# Patient Record
Sex: Female | Born: 1935 | Race: Black or African American | Hispanic: No | Smoking: Former smoker
Health system: Southern US, Community
[De-identification: ages and names within clinical notes are randomized; demographics above are authoritative.]

## PROBLEM LIST (undated history)

## (undated) DIAGNOSIS — N390 Urinary tract infection, site not specified: Secondary | ICD-10-CM

## (undated) DIAGNOSIS — N200 Calculus of kidney: Secondary | ICD-10-CM

## (undated) DIAGNOSIS — G309 Alzheimer's disease, unspecified: Secondary | ICD-10-CM

## (undated) DIAGNOSIS — F0280 Dementia in other diseases classified elsewhere without behavioral disturbance: Secondary | ICD-10-CM

## (undated) DIAGNOSIS — Z9289 Personal history of other medical treatment: Secondary | ICD-10-CM

## (undated) DIAGNOSIS — H409 Unspecified glaucoma: Secondary | ICD-10-CM

## (undated) DIAGNOSIS — H9193 Unspecified hearing loss, bilateral: Secondary | ICD-10-CM

## (undated) DIAGNOSIS — K5900 Constipation, unspecified: Secondary | ICD-10-CM

## (undated) DIAGNOSIS — M1990 Unspecified osteoarthritis, unspecified site: Secondary | ICD-10-CM

## (undated) DIAGNOSIS — H548 Legal blindness, as defined in USA: Secondary | ICD-10-CM

## (undated) DIAGNOSIS — I10 Essential (primary) hypertension: Secondary | ICD-10-CM

## (undated) HISTORY — PX: CATARACT EXTRACTION, BILATERAL: SHX1313

## (undated) HISTORY — PX: ABDOMINAL HYSTERECTOMY: SHX81

---

## 2000-09-04 ENCOUNTER — Ambulatory Visit (HOSPITAL_COMMUNITY): Admission: RE | Admit: 2000-09-04 | Discharge: 2000-09-05 | Payer: Self-pay | Admitting: Ophthalmology

## 2000-09-04 ENCOUNTER — Encounter (INDEPENDENT_AMBULATORY_CARE_PROVIDER_SITE_OTHER): Payer: Self-pay | Admitting: Specialist

## 2001-01-18 ENCOUNTER — Emergency Department (HOSPITAL_COMMUNITY): Admission: EM | Admit: 2001-01-18 | Discharge: 2001-01-19 | Payer: Self-pay | Admitting: *Deleted

## 2001-01-19 ENCOUNTER — Encounter: Payer: Self-pay | Admitting: *Deleted

## 2001-12-04 ENCOUNTER — Encounter: Payer: Self-pay | Admitting: Family Medicine

## 2001-12-04 ENCOUNTER — Ambulatory Visit (HOSPITAL_COMMUNITY): Admission: RE | Admit: 2001-12-04 | Discharge: 2001-12-04 | Payer: Self-pay | Admitting: Family Medicine

## 2001-12-11 ENCOUNTER — Ambulatory Visit (HOSPITAL_COMMUNITY): Admission: RE | Admit: 2001-12-11 | Discharge: 2001-12-11 | Payer: Self-pay | Admitting: Family Medicine

## 2001-12-11 ENCOUNTER — Encounter: Payer: Self-pay | Admitting: Family Medicine

## 2002-02-16 ENCOUNTER — Encounter: Payer: Self-pay | Admitting: Emergency Medicine

## 2002-02-16 ENCOUNTER — Inpatient Hospital Stay (HOSPITAL_COMMUNITY): Admission: EM | Admit: 2002-02-16 | Discharge: 2002-02-21 | Payer: Self-pay | Admitting: Emergency Medicine

## 2002-02-16 ENCOUNTER — Encounter: Payer: Self-pay | Admitting: Internal Medicine

## 2002-02-18 ENCOUNTER — Encounter: Payer: Self-pay | Admitting: Internal Medicine

## 2002-02-19 ENCOUNTER — Encounter: Payer: Self-pay | Admitting: Internal Medicine

## 2002-12-13 ENCOUNTER — Encounter: Payer: Self-pay | Admitting: Family Medicine

## 2002-12-13 ENCOUNTER — Ambulatory Visit (HOSPITAL_COMMUNITY): Admission: RE | Admit: 2002-12-13 | Discharge: 2002-12-13 | Payer: Self-pay | Admitting: Family Medicine

## 2003-02-23 ENCOUNTER — Encounter: Payer: Self-pay | Admitting: *Deleted

## 2003-02-23 ENCOUNTER — Emergency Department (HOSPITAL_COMMUNITY): Admission: EM | Admit: 2003-02-23 | Discharge: 2003-02-23 | Payer: Self-pay | Admitting: Emergency Medicine

## 2004-04-19 ENCOUNTER — Ambulatory Visit (HOSPITAL_COMMUNITY): Admission: RE | Admit: 2004-04-19 | Discharge: 2004-04-19 | Payer: Self-pay | Admitting: Family Medicine

## 2005-05-06 ENCOUNTER — Ambulatory Visit (HOSPITAL_COMMUNITY): Admission: RE | Admit: 2005-05-06 | Discharge: 2005-05-06 | Payer: Self-pay | Admitting: Family Medicine

## 2005-08-28 ENCOUNTER — Ambulatory Visit (HOSPITAL_COMMUNITY): Admission: RE | Admit: 2005-08-28 | Discharge: 2005-08-28 | Payer: Self-pay | Admitting: Family Medicine

## 2006-04-17 ENCOUNTER — Emergency Department (HOSPITAL_COMMUNITY): Admission: EM | Admit: 2006-04-17 | Discharge: 2006-04-17 | Payer: Self-pay | Admitting: Emergency Medicine

## 2006-08-17 ENCOUNTER — Emergency Department (HOSPITAL_COMMUNITY): Admission: EM | Admit: 2006-08-17 | Discharge: 2006-08-17 | Payer: Self-pay | Admitting: Dermatology

## 2006-12-08 ENCOUNTER — Inpatient Hospital Stay (HOSPITAL_COMMUNITY): Admission: EM | Admit: 2006-12-08 | Discharge: 2006-12-10 | Payer: Self-pay | Admitting: Emergency Medicine

## 2006-12-09 ENCOUNTER — Ambulatory Visit: Payer: Self-pay | Admitting: Vascular Surgery

## 2006-12-09 ENCOUNTER — Encounter (INDEPENDENT_AMBULATORY_CARE_PROVIDER_SITE_OTHER): Payer: Self-pay | Admitting: Cardiology

## 2006-12-09 ENCOUNTER — Encounter: Payer: Self-pay | Admitting: Vascular Surgery

## 2007-01-27 ENCOUNTER — Ambulatory Visit (HOSPITAL_COMMUNITY): Admission: RE | Admit: 2007-01-27 | Discharge: 2007-01-28 | Payer: Self-pay | Admitting: Ophthalmology

## 2007-02-21 ENCOUNTER — Inpatient Hospital Stay (HOSPITAL_COMMUNITY): Admission: EM | Admit: 2007-02-21 | Discharge: 2007-02-26 | Payer: Self-pay | Admitting: Emergency Medicine

## 2007-05-04 ENCOUNTER — Inpatient Hospital Stay (HOSPITAL_COMMUNITY): Admission: EM | Admit: 2007-05-04 | Discharge: 2007-05-06 | Payer: Self-pay | Admitting: *Deleted

## 2009-02-09 ENCOUNTER — Emergency Department (HOSPITAL_COMMUNITY): Admission: EM | Admit: 2009-02-09 | Discharge: 2009-02-09 | Payer: Self-pay | Admitting: Emergency Medicine

## 2010-08-16 ENCOUNTER — Inpatient Hospital Stay (HOSPITAL_COMMUNITY): Admission: EM | Admit: 2010-08-16 | Discharge: 2010-04-03 | Payer: Self-pay | Admitting: Emergency Medicine

## 2010-09-30 ENCOUNTER — Encounter: Payer: Self-pay | Admitting: Family Medicine

## 2010-10-18 ENCOUNTER — Emergency Department (HOSPITAL_COMMUNITY)
Admission: EM | Admit: 2010-10-18 | Discharge: 2010-10-18 | Disposition: A | Discharge status: Home / Self Care | Payer: Medicare Other | Attending: Emergency Medicine | Admitting: Emergency Medicine

## 2010-10-18 ENCOUNTER — Emergency Department (HOSPITAL_COMMUNITY): Payer: Medicare Other

## 2010-10-18 DIAGNOSIS — I10 Essential (primary) hypertension: Secondary | ICD-10-CM | POA: Insufficient documentation

## 2010-10-18 DIAGNOSIS — F0280 Dementia in other diseases classified elsewhere without behavioral disturbance: Secondary | ICD-10-CM | POA: Insufficient documentation

## 2010-10-18 DIAGNOSIS — N390 Urinary tract infection, site not specified: Secondary | ICD-10-CM | POA: Insufficient documentation

## 2010-10-18 DIAGNOSIS — G309 Alzheimer's disease, unspecified: Secondary | ICD-10-CM | POA: Insufficient documentation

## 2010-10-18 DIAGNOSIS — Z87442 Personal history of urinary calculi: Secondary | ICD-10-CM | POA: Insufficient documentation

## 2010-10-18 DIAGNOSIS — Z66 Do not resuscitate: Secondary | ICD-10-CM | POA: Insufficient documentation

## 2010-10-18 DIAGNOSIS — R319 Hematuria, unspecified: Secondary | ICD-10-CM | POA: Insufficient documentation

## 2010-10-18 LAB — HEPATIC FUNCTION PANEL
Albumin: 2.9 g/dL — ABNORMAL LOW (ref 3.5–5.2)
Alkaline Phosphatase: 65 U/L (ref 39–117)
Indirect Bilirubin: 0.6 mg/dL (ref 0.3–0.9)
Total Bilirubin: 0.9 mg/dL (ref 0.3–1.2)

## 2010-10-18 LAB — URINALYSIS, ROUTINE W REFLEX MICROSCOPIC
Ketones, ur: 15 mg/dL — AB
Protein, ur: 100 mg/dL — AB
Urine Glucose, Fasting: NEGATIVE mg/dL
Urobilinogen, UA: 0.2 mg/dL (ref 0.0–1.0)

## 2010-10-18 LAB — DIFFERENTIAL
Basophils Relative: 0 % (ref 0–1)
Eosinophils Absolute: 0.1 10*3/uL (ref 0.0–0.7)
Eosinophils Relative: 2 % (ref 0–5)
Lymphs Abs: 1.7 10*3/uL (ref 0.7–4.0)
Monocytes Relative: 22 % — ABNORMAL HIGH (ref 3–12)
Neutrophils Relative %: 35 % — ABNORMAL LOW (ref 43–77)

## 2010-10-18 LAB — BASIC METABOLIC PANEL
BUN: 11 mg/dL (ref 6–23)
Calcium: 9.4 mg/dL (ref 8.4–10.5)
Chloride: 109 mEq/L (ref 96–112)
Creatinine, Ser: 0.84 mg/dL (ref 0.4–1.2)
GFR calc Af Amer: >60 mL/min (ref >60)
GFR calc non Af Amer: >60 mL/min (ref >60)

## 2010-10-18 LAB — CBC
MCH: 29.2 pg (ref 26.0–34.0)
MCHC: 33.4 g/dL (ref 30.0–36.0)
MCV: 87.3 fL (ref 78.0–100.0)
Platelets: 134 10*3/uL — ABNORMAL LOW (ref 150–400)
RDW: 14.1 % (ref 11.5–15.5)

## 2010-10-19 LAB — URINE CULTURE: Culture: NO GROWTH

## 2010-11-24 LAB — CBC
HCT: 31.5 % — ABNORMAL LOW (ref 36.0–46.0)
HCT: 33 % — ABNORMAL LOW (ref 36.0–46.0)
HCT: 36.1 % (ref 36.0–46.0)
Hemoglobin: 12.2 g/dL (ref 12.0–15.0)
Hemoglobin: 12.4 g/dL (ref 12.0–15.0)
MCH: 30.3 pg (ref 26.0–34.0)
MCH: 30.5 pg (ref 26.0–34.0)
MCH: 30.6 pg (ref 26.0–34.0)
MCH: 30.7 pg (ref 26.0–34.0)
MCH: 30.7 pg (ref 26.0–34.0)
MCH: 30.9 pg (ref 26.0–34.0)
MCHC: 33.4 g/dL (ref 30.0–36.0)
MCHC: 34.5 g/dL (ref 30.0–36.0)
MCV: 88.6 fL (ref 78.0–100.0)
MCV: 89.6 fL (ref 78.0–100.0)
MCV: 90.2 fL (ref 78.0–100.0)
MCV: 90.4 fL (ref 78.0–100.0)
MCV: 90.4 fL (ref 78.0–100.0)
MCV: 90.9 fL (ref 78.0–100.0)
MCV: 91.3 fL (ref 78.0–100.0)
MCV: 91.6 fL (ref 78.0–100.0)
Platelets: 143 10*3/uL — ABNORMAL LOW (ref 150–400)
Platelets: 153 10*3/uL (ref 150–400)
Platelets: 155 10*3/uL (ref 150–400)
Platelets: 155 10*3/uL (ref 150–400)
Platelets: 163 10*3/uL (ref 150–400)
Platelets: 165 10*3/uL (ref 150–400)
Platelets: 172 10*3/uL (ref 150–400)
Platelets: 173 10*3/uL (ref 150–400)
RBC: 3.99 MIL/uL (ref 3.87–5.11)
RBC: 4.03 MIL/uL (ref 3.87–5.11)
RBC: 4.11 MIL/uL (ref 3.87–5.11)
RDW: 11.9 % (ref 11.5–15.5)
RDW: 12.2 % (ref 11.5–15.5)
RDW: 12.2 % (ref 11.5–15.5)
RDW: 12.2 % (ref 11.5–15.5)
RDW: 12.3 % (ref 11.5–15.5)
RDW: 12.5 % (ref 11.5–15.5)
RDW: 12.5 % (ref 11.5–15.5)
WBC: 4.9 10*3/uL (ref 4.0–10.5)
WBC: 4.9 10*3/uL (ref 4.0–10.5)
WBC: 5.2 10*3/uL (ref 4.0–10.5)
WBC: 5.7 10*3/uL (ref 4.0–10.5)
WBC: 6 10*3/uL (ref 4.0–10.5)

## 2010-11-24 LAB — DIFFERENTIAL
Basophils Absolute: 0 10*3/uL (ref 0.0–0.1)
Basophils Absolute: 0 10*3/uL (ref 0.0–0.1)
Basophils Absolute: 0.1 10*3/uL (ref 0.0–0.1)
Basophils Relative: 1 % (ref 0–1)
Basophils Relative: 1 % (ref 0–1)
Basophils Relative: 1 % (ref 0–1)
Eosinophils Absolute: 0.2 10*3/uL (ref 0.0–0.7)
Eosinophils Absolute: 0.2 10*3/uL (ref 0.0–0.7)
Eosinophils Absolute: 0.2 10*3/uL (ref 0.0–0.7)
Eosinophils Absolute: 0.2 10*3/uL (ref 0.0–0.7)
Eosinophils Relative: 3 % (ref 0–5)
Eosinophils Relative: 4 % (ref 0–5)
Eosinophils Relative: 4 % (ref 0–5)
Eosinophils Relative: 5 % (ref 0–5)
Lymphocytes Relative: 28 % (ref 12–46)
Lymphocytes Relative: 30 % (ref 12–46)
Lymphs Abs: 1.8 10*3/uL (ref 0.7–4.0)
Lymphs Abs: 2 10*3/uL (ref 0.7–4.0)
Monocytes Absolute: 0.8 10*3/uL (ref 0.1–1.0)
Monocytes Relative: 15 % — ABNORMAL HIGH (ref 3–12)
Neutro Abs: 3.2 10*3/uL (ref 1.7–7.7)
Neutrophils Relative %: 45 % (ref 43–77)
Neutrophils Relative %: 46 % (ref 43–77)
Neutrophils Relative %: 47 % (ref 43–77)
Neutrophils Relative %: 55 % (ref 43–77)

## 2010-11-24 LAB — COMPREHENSIVE METABOLIC PANEL
AST: 28 U/L (ref 0–37)
CO2: 26 mEq/L (ref 19–32)
Calcium: 9.6 mg/dL (ref 8.4–10.5)
Creatinine, Ser: 0.63 mg/dL (ref 0.4–1.2)
GFR calc Af Amer: >60 mL/min (ref >60)
GFR calc non Af Amer: >60 mL/min (ref >60)
Glucose, Bld: 93 mg/dL (ref 70–99)
Sodium: 138 mEq/L (ref 135–145)
Total Protein: 6.6 g/dL (ref 6.0–8.3)

## 2010-11-24 LAB — URINALYSIS, ROUTINE W REFLEX MICROSCOPIC
Ketones, ur: 40 mg/dL — AB
Nitrite: POSITIVE — AB
Protein, ur: >300 mg/dL — AB
Urobilinogen, UA: 1 mg/dL (ref 0.0–1.0)

## 2010-11-24 LAB — BASIC METABOLIC PANEL
BUN: 2 mg/dL — ABNORMAL LOW (ref 6–23)
BUN: 3 mg/dL — ABNORMAL LOW (ref 6–23)
BUN: 3 mg/dL — ABNORMAL LOW (ref 6–23)
BUN: 3 mg/dL — ABNORMAL LOW (ref 6–23)
CO2: 20 mEq/L (ref 19–32)
CO2: 21 mEq/L (ref 19–32)
CO2: 22 mEq/L (ref 19–32)
Chloride: 109 mEq/L (ref 96–112)
Chloride: 109 mEq/L (ref 96–112)
Chloride: 111 mEq/L (ref 96–112)
Creatinine, Ser: 0.46 mg/dL (ref 0.4–1.2)
Creatinine, Ser: 0.49 mg/dL (ref 0.4–1.2)
Creatinine, Ser: 0.61 mg/dL (ref 0.4–1.2)
Creatinine, Ser: 0.63 mg/dL (ref 0.4–1.2)
GFR calc Af Amer: >60 mL/min (ref >60)
GFR calc non Af Amer: >60 mL/min (ref >60)
GFR calc non Af Amer: >60 mL/min (ref >60)
Glucose, Bld: 85 mg/dL (ref 70–99)
Potassium: 3.1 mEq/L — ABNORMAL LOW (ref 3.5–5.1)

## 2010-11-24 LAB — APTT: aPTT: 22 seconds — ABNORMAL LOW (ref 24–37)

## 2010-11-24 LAB — HEMOGLOBIN AND HEMATOCRIT, BLOOD
HCT: 29.7 % — ABNORMAL LOW (ref 36.0–46.0)
HCT: 30.9 % — ABNORMAL LOW (ref 36.0–46.0)
Hemoglobin: 10.5 g/dL — ABNORMAL LOW (ref 12.0–15.0)
Hemoglobin: 10.5 g/dL — ABNORMAL LOW (ref 12.0–15.0)

## 2010-11-24 LAB — URINE CULTURE
Colony Count: NO GROWTH
Culture: NO GROWTH
Special Requests: NEGATIVE

## 2010-11-24 LAB — URINE MICROSCOPIC-ADD ON

## 2010-11-24 LAB — PROTIME-INR
INR: 0.97 (ref 0.00–1.49)
Prothrombin Time: 12.8 seconds (ref 11.6–15.2)

## 2010-11-24 LAB — MAGNESIUM: Magnesium: 2.1 mg/dL (ref 1.5–2.5)

## 2010-12-17 LAB — CBC
HCT: 39.7 % (ref 36.0–46.0)
Hemoglobin: 12.9 g/dL (ref 12.0–15.0)
RBC: 4.25 MIL/uL (ref 3.87–5.11)
RDW: 13.3 % (ref 11.5–15.5)

## 2010-12-17 LAB — URINALYSIS, ROUTINE W REFLEX MICROSCOPIC
Bilirubin Urine: NEGATIVE
Nitrite: NEGATIVE
Protein, ur: 30 mg/dL — AB
Specific Gravity, Urine: 1.014 (ref 1.005–1.030)
Urobilinogen, UA: 0.2 mg/dL (ref 0.0–1.0)

## 2010-12-17 LAB — POCT I-STAT, CHEM 8
BUN: 12 mg/dL (ref 6–23)
Calcium, Ion: 1.3 mmol/L (ref 1.12–1.32)
Chloride: 108 mEq/L (ref 96–112)
Creatinine, Ser: 0.9 mg/dL (ref 0.4–1.2)
Glucose, Bld: 88 mg/dL (ref 70–99)
TCO2: 22 mmol/L (ref 0–100)

## 2010-12-17 LAB — DIFFERENTIAL
Basophils Absolute: 0.1 10*3/uL (ref 0.0–0.1)
Eosinophils Relative: 2 % (ref 0–5)
Lymphocytes Relative: 27 % (ref 12–46)
Lymphs Abs: 1.8 10*3/uL (ref 0.7–4.0)
Monocytes Absolute: 0.9 10*3/uL (ref 0.1–1.0)
Monocytes Relative: 14 % — ABNORMAL HIGH (ref 3–12)

## 2010-12-17 LAB — URINE MICROSCOPIC-ADD ON

## 2011-01-22 NOTE — Discharge Summary (Signed)
NAME:  Jean Shaw, Jean Shaw          ACCOUNT NO.:  [id number]   MEDICAL RECORD NO.:  [id number]          PATIENT TYPE:  INP   LOCATION:  6705                         FACILITY:  [facility]   PHYSICIAN:  Fleet Contras, M.D.    DATE OF BIRTH:  [birth year]   DATE OF ADMISSION:  12/08/2006  DATE OF DISCHARGE:  12/10/2006                               DISCHARGE SUMMARY   HISTORY OF PRESENTING ILLNESS:  [name] is a 77 year old African-  American lady with a past medical history significant for hypertension,  Alzheimer's dementia, hearing impairment, and glaucoma.  She was brought  into the emergency room by her daughter with complaints of near syncope,  but she did not fall.  Relation by the EMS at the scene, she was found  to have a low blood pressure, but on arrival at the emergency room her  blood pressure was normal.  She had had no fevers or chills.  She had  apparently had a flu-like syndrome.  Been having frequent urination, but  no vomiting or diarrhea.  In the emergency room, her blood pressure was  160/78, heart rate of 87, respiratory rate of 18, temperature 97.5, O2  sats on room air was 98 to 100%.  She was well-hydrated.  She was  afebrile.  She was not icteric.  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  Her chest was clear to auscultation.  Abdomen was benign.   LABORATORY DATA:  Shows trace leukocyte esterase with 3 to 6 WBCs per  high-power field urinalysis.  Chest x-ray shows cardiomegaly with no  acute infiltrates.  A CT scan of her head was negative.  Sodium was 144,  potassium 3.5, chloride 113, bicarbonate 17, BUN 14, glucose 89.  Hemoglobin was 13.3, hematocrit 39.  CT scan of the chest was negative  for pulmonary embolism.   She was admitted to the hospital for close monitoring and treatment of  urinary tract infection.   HOSPITAL COURSE:  On admission, the patient was started on IV fluids and  IV Cipro.  Her blood pressure medication were resumed the  following  morning.  Potassium was repleted orally.  TSH was checked and was within  normal limits.  Carotid Doppler study was performed.  This showed no  carotid artery stenosis.  A 2D echocardiogram was normal for LV ejection  fraction.  On December 10, 2006, her vital signs were stable.  She was  started on a full diet and had no further symptoms.  She was therefore  considered stable for discharge home.   DISCHARGE DIAGNOSES:  1. Near syncope.  Resolved.  2. Urinary tract infection.  3. Hypertension.  4. Alzheimer's dementia.   DISCHARGE MEDICATIONS:  1. Aspirin 81 mg daily.  2. Aricept 10 mg daily.  3. Namenda 5 mg b.i.d.  4. Avapro 150 three times daily.  5. Cipro 250 mg b.i.d. for five more days.   FOLLOWUP:  She is to follow up with me in the office in 2 weeks.   CONDITION ON DISCHARGE:  Condition on discharge is stable.   DISPOSITION:  Home.  Fleet Contras, M.D.  Electronically Signed     EA/MEDQ  D:  02/17/2007  T:  02/18/2007  Job:  [id number]

## 2011-01-25 NOTE — Discharge Summary (Signed)
NAME:  Jean Shaw, KULIK          ACCOUNT NO.:  [id number]   MEDICAL RECORD NO.:  [id number]          PATIENT TYPE:  INP   LOCATION:  3701                         FACILITY:  [facility]   PHYSICIAN:  Fleet Contras, M.D.    DATE OF BIRTH:  07/18/1929   DATE OF ADMISSION:  02/21/2007  DATE OF DISCHARGE:  02/26/2007                               DISCHARGE SUMMARY   HISTORY OF PRESENT ILLNESS:  Ms. Rochefort is a 77 year old African-  American lady with a past medical history significant for hypertension,  Alzheimer's dementia, deafness, blindness.  She was admitted through the  emergency room at [facility] with an episode of a near syncope  at home, she did not pass out, and denied having any chest pain or  shortness of breath.  She had no falls, no fevers or chills.  By the  time she was picked up by the EMS, she was noted to be hypotensive,  bradycardiac with a heart rate in the 40s.  On arrival at the emergency  room, her blood pressure and her heart rates were normal.  On admission,  laboratory data shows a low blood sugar of 67.  The daughter reported  that she had not been eating or drinking much recently.   PHYSICAL EXAMINATION:  GENERAL/VITALS:  On admission, she was slightly  agitated.  Initial blood pressure was 147/63, heart rate was 62,  respiratory rate of 20, temperature 97.4, her O2 sats on room air was  100%.  She was not pale, she was afebrile, she was not icteric.  Her  lips cyanotic although well hydrated.  CHEST:  Clear to auscultation.  ABDOMEN:  Benign.  PSYCH/NEURO:  She was alert, responsiveness, no focal deficits.   LABORATORY DATA:  Negative urinalysis.  Chest x-ray showed mild  cardiomegaly with bibasilar atelectasis.  Sodium was 141, potassium 4.4,  chloride 114, bicarbonate was 18, BUN 14, creatinine 0.8, glucose of 67.  EKG showed normal sinus rhythm.   ADMISSION DIAGNOSES:  1. Near syncope.  2. Bradycardia.  3. Hypoglycemia.   HOSPITAL COURSE:   On admission, she was placed in a telemetry bed.  She  was started on gentle IV hydration.  DVT and GI prophylaxis.  Tele  monitoring was monitored.  Serial CKs and troponin were done to rule out  acute coronary syndrome, and this was all negative.  A 2-D  echocardiogram and carotid Doppler were also performed.  A cardiology  consult was requested to evaluate for possible sick sinus syndrome.  Due  to poor oral intake, she was started on Megace.  CBGs were checked 8,  h.s., a.c.  For the past 24 hours, this has been stable.  Patient was  seen by Dr. Nanetta Batty, cardiologist, who recommended outpatient  event monitor for possible cause of presyncope.  Morning cortisol level  was 1.1, TSH was 5.26.  Endocrinology consult was requested as the  patient was a friend of Dr. Margaretmary Bayley who thought that cortisol  suppression was partly due to eye drops containing Decadron Pred Forte,  TobraDex which may have caused renal insufficiency.  He recommended a  tapering  dose of prednisone for a couple of weeks.  A D-dimer was  performed and this was slightly elevated at 1.1.  A CT scan of the chest  was, therefore, performed to rule out pulmonary embolism, and this was  negative.  She had a recent carotid Doppler and 2-D echocardiogram done  and these were negative, and these were repeated.  On February 26, 2007, she  was feeling much better, eating more, and was eager to go home.  Blood  pressure was elevated at 160/75, and she was, therefore, resumed on her  Avapro 60 mg daily as well as prior to admission medications, including  Aricept and Namenda.  Vital signs were stable, and she was felt stable  for discharge.   DISCHARGE MEDICATIONS:  1. Avapro 150 mg daily.  2. Namenda 5 mg b.i.d.  3. Aricept 10 mg q.h.s.  4. Diamox 1200 mg b.i.d.  5. Alphagan eye drops 1 drop to each eye b.i.d.  6. Aspirin 325 mg daily.  7. Prednisone 5 mg b.i.d. for 7 days then 1 mg daily.   She is to follow up with  me in the office in about 2 weeks.  This plan  of care was discussed with her and her daughter.  Their questions were  answered.      Fleet Contras, M.D.  Electronically Signed     EA/MEDQ  D:  03/30/2007  T:  03/31/2007  Job:  [id number]

## 2011-01-25 NOTE — Op Note (Signed)
NAME:  Jean Shaw, Jean Shaw          ACCOUNT NO.:  [id number]   MEDICAL RECORD NO.:  [id number]          PATIENT TYPE:  AMB   LOCATION:  SDS                          FACILITY:  [facility]   PHYSICIAN:  John D. Ashley Royalty, M.D. DATE OF BIRTH:  08/26/1929   DATE OF PROCEDURE:  01/27/2007  DATE OF DISCHARGE:                               OPERATIVE REPORT   ADMISSION DIAGNOSIS:  Malignant glaucoma left eye.   PROCEDURES:  Pars plana vitrectomy, retinal photocoagulation,  capsulectomy, peripheral iridectomy all in the left eye.   SURGEON:  Alan Mulder, M.D.   ASSISTANT:  Rosalie Doctor, MA   ANESTHESIA:  General.   DETAILS:  Usual prep and drape, trocars at 10, 2 and 4 o'clock.  Infusion port at 4 o'clock.  Provisc placed on the corneal surface.  The  pars plana vitrectomy was begun just behind the pseudophakos.  The  posterior capsule was opacified and removed with the vitreous cutter.  The vitrectomy was carried up to the edge of the iris from behind so  that all vitreous was removed from the backside of the iris.  A large  peripheral iridectomy was created at 5 o'clock.  The vitrectomy was  carried posteriorly where vitreous strands were encountered.  Provisc  was placed on the corneal surface and the biome viewing system was moved  into place.  The vitrectomy was carried down to the macular surface in a  core fashion.  It was carried out into the far periphery and the small  pupil limited the view.  The vitrectomy was carried down to the vitreous  base for 360 degrees.  At this point the endolaser was positioned in the  eye and 588 burns were placed around the retinal periphery in two rows  with a power of 1200 milliwatts, 1000 microns each and 0.1 seconds each.  The instruments were removed from the eye and the trocars were removed  from the eye.  The conjunctiva was allowed to slide over the scleral  wounds and the wounds were held until tight.  Polymyxin and gentamicin  were irrigated  to tenon's space, Decadron 10 mg was injected into the  lower subconjunctival space.  Atropine solution was applied.  Marcaine  was injected around the globe for postop pain.  TobraDex ophthalmic  ointment was placed as well as a patch and shield.  The closing pressure  was 15 with a Baer keratometer.  Complications none.  Duration 1 hour.  The patient is awakened and taken to recovery in satisfactory condition.      [name], M.D.  Electronically Signed     JDM/MEDQ  D:  01/27/2007  T:  01/27/2007  Job:  [id number]

## 2011-01-25 NOTE — Discharge Summary (Signed)
[facility]  Patient:    Jean Shaw, Jean Shaw Visit Number: [id number] MRN: [id number]          Service Type: MED Location: 3A Y782 [id number] Attending Physician:  Elliot Cousin Dictated by:   Elliot Cousin, M.D. Admit Date:  02/16/2002 Discharge Date: 02/21/2002                             Discharge Summary  DISCHARGE DIAGNOSES: 1. Dyspnea on exertion. 2. Abdominal fullness/epigastric pain. 3. Moderate acute gastroduodenitis per esophagogastroduodenoscopy,    February 19, 2002. 4. Bradycardia. 5. Hypertension. 6. Mildly elevated pulmonary pressures and mild tricuspid regurgitation with    ejection fraction 55-60% per 2-D echocardiogram, February 17, 2002.  SECONDARY DISCHARGE DIAGNOSES: 1. Chronic dizziness thought to be secondary to vertigo. 2. Bilateral glaucoma, status post left eye surgery secondary to glaucoma. 3. History of total abdominal hysterectomy. 4. Hospitalization, July 2001, secondary to bradycardia and near-syncope.    a. A CT scan of the head revealed no acute abnormalities.    b. Ultrasound and carotid Dopplers bilaterally revealed no significant       abnormality.    c. Two-dimensional echocardiogram in July 2001 revealed moderate left       ventricular hypertrophy, ejection fraction 65-70%, mild trivial mitral       regurgitation and mild elevation in right-sided pressures with normal       right ventricular function. 5. History of abdominal pain in July of 2001. 6. Poor hearing acuity. 7. 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  DISCHARGE MEDICATIONS: 1. Avapro 150 mg q.d. 2. Plendil 5 mg q.d. 3. Reglan 5 mg q.a.c. and q.h.s. 4. Enteric-coated aspirin 81 mg one q.d. 5. Lumigan 0.03% eye drops -- one drop, each eye, at bedtime. 6. Trusopt 2% eye drop -- one drop, right eye, at 8 a.m., noon and 4 p.m. q.d. 7. Pepcid one tablet every 12 hours.  HOSPITAL DISCHARGE DISPOSITION:  The patient was discharged to home  in improved and stable condition on February 21, 2002.  She has a followup appointment with her primary care physician, Dr. Mirna Mires, in one week.  CONSULTATIONS:  Elpidio Anis, M.D.  PROCEDURES PERFORMED: 1. Two-dimensional echocardiogram.  The results revealed a normal left    ventricular function with an ejection fraction of 55-60%, no focal wall    motion abnormalities noted, mild tricuspid regurgitation, mildly elevated    pulmonary pressures. 2. CT scans of the abdomen and pelvis.  CT scan of the abdomen revealed a    small amount of ascites within the abdomen, primarily on the right side,    ? small filling defect along the posterior stomach, small periumbilical    hernia, left renal cyst and nonobstructing left renal calculus.  CT scan of    the pelvis revealed small-to-moderate amount of free fluid within the    pelvis, a few areas of small bowel wall thickening, no occlusion of celiac    or mesenteric veins and arteries. 3. Ultrasound of the abdomen revealed no ascites seen, left lower pole cyst.    No acute cholecystitis. 4. Ultrasound of the pelvis/transvaginal ultrasound.  The results revealed no    evidence of adnexal mass or ascites.  Uterus is surgically absent. 5. Esophagogastroduodenoscopy performed February 19, 2002.  The results revealed a    normal esophagus, moderate acute gastroduodenitis, no ulcerations or    erosions.  No mass effect.  Easily distensible stomach and  duodenum.  HISTORY OF PRESENT ILLNESS:  The patient is a 77 year old African American woman who presented to the emergency room on the morning of February 16, 2002 with a complaint of shortness of breath such as "it was going to be my last breath."  The patient says that she felt like "my breath was being cut off." She denies any associated chest pain but did complain of fullness in her lower chest wall and upper abdomen.  When the patient arrived to the emergency department, she was oxygenating 99% on room  air and the chest x-ray revealed no acute abnormalities.  However, the patient was noted to have bradycardia on the EKG with a rate of 55 and nonspecific T wave and ST wave changes; the patient was therefore admitted for acute dyspnea and evaluation of abdominal fullness and bradycardia with ST and T wave changes on EKG.  For additional details, please see the admitting history and physical.  HOSPITAL COURSE: #1 - DYSPNEA ON EXERTION:  As stated above, the patient was initially evaluated with a chest x-ray which revealed no acute abnormalities, however, she did have mild right basilar atelectasis on the film.  An ABG was obtained and revealed a pH of 7.4, PCO2 of 32 and a PO2 of 74.8 on room air; the patient was placed on 2 L nasal cannula oxygen empirically for the first 24 hours of hospitalization, however.  Additional evaluation was obtained with a 2-D echocardiogram, obtaining cardiac enzymes to rule out an acute myocardial infarction, and obtaining thyroid function tests.  The 2-D echocardiogram revealed a normal left ventricular function with an estimated ejection fraction at 55-60%, no pericardial effusion, mild tricuspid regurgitation, and mildly elevated pulmonary pressures.  The cardiac enzymes were completely within normal limits x3 sets.  The TSH was within normal limits at 4.213 and the free T4 was within normal limits at 1.00.  The cause of the patients acute dyspnea is unknown, however, she was completely asymptomatic during the majority of the hospital course.  She continued to oxygenate in the 98-99% range on room air.  The oxygen was eventually discontinued.  The patient was able to ambulate without shortness of breath prior to hospital discharge.  #2 - ABDOMINAL FULLNESS/EPIGASTRIC PAIN:  The patient was quite tender diffusely over her abdomen, with more concentration of tenderness in the right upper quadrant during the initial assessment.  She kept complaining of  feeling sore and full in her epigastric region, therefore, the patient was treated empirically with 20 mg of Lasix IV x1, Reglan 5 mg IV q.a.c. and q.h.s., and  Pepcid 20 mg IV q.12h.  For further evaluation, CT scans of the abdomen and pelvis with contrast were ordered.  The CT scan of the abdomen per radiology was significant for a small amount of ascites within the abdomen, question of filling defect along the posterior stomach, and a periumbilical hernia containing fat as well as a nonobstructing left renal calculus.  CT scan of the pelvis revealed a moderate amount of free fluid within the pelvis, a few small bowel loops within the pelvis which appeared to be mildly thickened, and patent blood vessels.  Given these findings, ultrasounds of the abdomen and pelvis were ordered for further clarification.  The ultrasound of the abdomen revealed no ascites and no evidence of acute cholecystitis.  The ultrasound of the pelvis was a transvaginal ultrasound and revealed no evidence of adnexal mass or ascites; the ultrasound also confirmed that the patient had a surgically absent uterus.  Given the finding of the questionable defect in the stomach per radiology, Dr. Elpidio Anis was consulted for further evaluation.  Dr. Katrinka Blazing performed an EGD on February 19, 2002.  The results revealed moderate gastroduodenitis with no other abnormality seen.  The patient was advanced from a clear liquid diet to a soft diet during the hospital course.  Remarkably, after 24 hours of treatment with clear liquids, Reglan and Pepcid, the patient felt remarkably better.  In addition, the patient was given a soapsuds enema soon after admission which revealed large amounts of stool.  The evacuation of stool may have also been responsible for the patients clinical improvement.  The patient will continue to be treated with Reglan 5 mg p.o. q.a.c. and q.h.s. as well as Pepcid 20 mg p.o. q.12h. She was pain-free and  asymptomatic after her diet was advanced.  The amylase, lipase and liver function tests which were obtained for assessment were all within normal limits.  #3 - BRADYCARDIA:  The patient has a history of episodic bradycardia in the past.  She has been evaluated by her cardiologist in the past and the patient is felt to have episodic bradycardia with no long-term sequelae.  The patients heart rate per EKG in the emergency department was 55 with ST and T wave changes that were nonspecific.  A repeat EKG approximately an hour later revealed normal sinus rhythm with a rate of 71.  The patient was admitted to a telemetry bed and cardiac enzymes were obtained x24 hours. During the hospital course, the telemetry was within normal limits with readings of normal sinus rhythm with a rate ranging from 61 at rest and at sleep to mid-70s when awake.  The cardiac enzymes, as stated above, were all completely within normal limits.  Two-dimensional echocardiogram revealed good LV function.  No further workup was warranted during the hospitalization.  #4 - HYPERTENSION:  The patient has a long history of hypertension that has been under fair-to-poor control in the past.  She was started on Avapro 150 mg q.d. and Plendil 5 mg q.d. in the hospital; she was also treated with a 4 g sodium diet.  She tolerated the new regimen of antihypertensives very well. Her blood pressures systolically ranged between 140s and 160s.  Further management with increasing/titrating the doses as tolerated will be managed as an outpatient. Dictated by:   Elliot Cousin, M.D. Attending Physician:  Elliot Cousin DD:  02/23/02 TD:  02/25/02 Job: [id number]

## 2011-01-25 NOTE — H&P (Signed)
[facility]  Patient:    Jean Shaw, Jean Shaw Visit Number: [id number] MRN: [id number]          Service Type: MED Location: 2A A228 01 Attending Physician:  Elliot Cousin Dictated by:   Elpidio Anis, M.D. Admit Date:  02/16/2002                           History and Physical  HISTORY OF PRESENT ILLNESS:  A 77 year old female who is a very poor historian who is being evaluated for a very vague abdominal discomfort.  The patients history at this time is changed from her presenting history.  She complained of having severe epigastric pain and some problems with her bowel movements. She also gives a history of indigestion.  Patient was admitted through the emergency room for evaluation of this complaint and also for evaluation of an abnormal EKG.  Since admission, she was found to have normal upper abdominal ultrasound, normal pelvic ultrasound except for some fluid in her cul de sac, but CT showed a focal filling defect in the posterior wall of the stomach measuring 1.5 x 0.7 cm.  This study was otherwise unremarkable.  She was therefore scheduled for upper endoscopy.  PAST MEDICAL HISTORY:  Significant for chronic dizziness, hypertension, and osteoarthritis.  Surgery includes total abdominal hysterectomy.  PHYSICAL EXAMINATION:  GENERAL:  On exam, she is in no acute distress.  VITAL SIGNS:  Blood pressure is 170/80, pulse 76, respirations 20, temperature 97.8.  HEENT:  Unremarkable except for poor dentition, absent upper teeth.  NECK:  Supple without JVD or bruit.  CHEST:  Clear to auscultation.  HEART:  No murmur, gallop, or rub, normal S1 and S2.  ABDOMEN:  Mildly obese without distention.  No tenderness noted.  Normal active bowel sounds.  RECTAL:  Not repeated since it was normal by Dr. Sherrie Mustache and the stool was guaiac negative.  EXTREMITIES:  Exam is unremarkable.  She has good peripheral pulses.  NEUROLOGIC:  No focal  deficit.  IMPRESSION: 1. Abdominal pain with CC findings of ascites in the mid abdomen and    thickening with a defect in the posterior aspect of the stomach and some    small bowel thickening. 2. History of bradycardia presently resolved. 3. Hypertension.  PLAN:  Upper endoscopy. Dictated by:   Elpidio Anis, M.D. Attending Physician:  Elliot Cousin DD:  02/18/02 TD:  02/18/02 Job: [id number]

## 2011-01-25 NOTE — H&P (Signed)
[facility]  Patient:    Jean Shaw, Jean Shaw Visit Number: [id number] MRN: [id number]          Service Type: OBV Location: 2A A228 01 Attending Physician:  Annamarie Dawley Dictated by:   Elliot Cousin, M.D. Admit Date:  02/16/2002                           History and Physical  CHIEF COMPLAINT:  Shortness of breath and abdominal fullness.  HISTORY OF PRESENT ILLNESS:  Jean Shaw is a 77 year old African-American woman with a past medical history significant for chronic dizziness and hypertension who presented to the emergency department this morning with a bout of shortness of breath early this morning.  The patient states that she was feeling fine the night before.  As she was getting up out of bed, she felt extremely short of breath like "it was going to be my last breath."  She says she felt as if her "breath was being cut off."  She denied chest pain; however, she had felt some fullness in her lower chest wall and upper abdomen. She has a history of constipation with straining requiring milk of magnesia several times a week; however, she did have a bowel movement last night that was considered loose.  She does have some associated indigestion and history of occasional heartburn.  She has had no evidence of black tarry stools nor has she seen any bright red blood per rectum.  She denies having orthopnea or swelling in her legs.  She denies any recent increase in cough, dry or productive.  She denies any pleuritic chest pain.  She denies diaphoresis.  She does not use any chronic NSAIDs with the exception of a coated aspirin 325 mg q.d.  She denies alcohol use.  She has had no recent complaints of dysphagia, nausea, vomiting, or diarrhea.  She denies any dysuria.  When the patient was initially seen in the emergency department, she was found to have a chest x-ray which was nonacute with the exception of mild right lower lobe atelectasis.  She  was oxygenating 99% on room air.  However, the emergency department physician noted that the patient had an EKG initially with bradycardia with a rate of 55 and nonspecific T wave and ST wave changes. After approximately one hour, a repeat EKG reveals normal sinus rhythm with a rate of 71 and some resolution of the nonspecific ST and T wave changes.  When the patient was examined in the emergency department, she was found to have a diffusely tender abdomen with mild distention.  The patient was, therefore, admitted for further evaluation and management of abdominal pain and tenderness, abdominal fullness, and bradycardia with ST-T wave abnormalities.  It was noted that the patients vital signs as well as cardiac enzymes were within normal limits.  REVIEW OF SYSTEMS:  Positive for occasional headaches, positive for blurred vision, positive for occasional indigestion and heartburn, positive for constipation with straining, positive for chronic dizziness, positive for hard of hearing, positive for degenerative joint disease in her left ankle and left shoulder.  Review of Systems is negative for fever, chills, weight loss, nausea, vomiting, diarrhea, hematemesis, hemoptysis, melena, hematochezia, dysuria, hematuria, and swelling in her legs.  PAST MEDICAL HISTORY: 1. Hypertension. 2. Chronic dizziness thought to be secondary to vertigo. 3. Bilateral glaucoma status post left eye surgery secondary to glaucoma. 4. History of total abdominal hysterectomy. 5. Hospitalization July 2001 secondary to bradycardia  and near syncope.    a. A CT scan of the head revealed no acute abnormalities.    b. Ultrasound and carotid Dopplers bilaterally revealed no significant       abnormality.    c. A 2-D echocardiogram revealed moderate left ventricular hypertrophy,       ejection fraction 65 to 70%, trivial mitral regurgitation, and mild       elevation in right-sided pressure with normal right  ventricular       function. 6. History of abdominal pain in July 2001.  Ultrasound of the abdomen    revealed an 8 mm stone in the upper pole of the left kidney, minimal    arteriosclerotic disease of the abdominal aorta, and no other specific    abnormality. 7. Poor hearing acuity. 8. Chronic dizziness. 9. 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  CURRENT MEDICATIONS: 1. Lotrel 10/20 mg 1 q.d. started February 15, 2002. 2. Potassium chloride (Klor-Con) 10 mEq 1 q.d. 3. Enteric-coated aspirin 325 mg q.d. 4. Antivert 25 mg q.8h. p.r.n. dizziness. 5. Premarin 1.25 mg q.d. 6. Trusopt ophthalmic solution 1 drop into the right eye 3 times daily. 7. Lumigan 0.03%, 1 drop each eye at bedtime.  ALLERGIES:  PLAIN ASPIRIN.  SOCIAL HISTORY:  The patient is widowed.  She lives in an apartment building alone in [place], [place].  She does have one grown daughter and one grandson.  She has a home health aide who comes to assist her Monday through Friday for two to three hours daily.  She completed the 10th grade in school. She can read and write.  She denies alcohol.  She stopped smoking approximately five to six years ago.  FAMILY HISTORY:  Her mother and father died years ago, the cause of death of her father is unknown, and the cause of death of her mother may have been related to complications of diabetes mellitus.  PHYSICAL EXAMINATION:  VITAL SIGNS:  Temperature 98.7, pulse 76, respiratory rate 20, blood pressure 207/97.  Oxygen saturation 99% on room air.  GENERAL:  The patient is an elderly African-American woman who was currently lying in bed in no acute distress.  HEENT:  Head is normocephalic, atraumatic.  Both pupils have diffuse scarring and greyish colored tint.  The pupils are not reactive.  Conjunctiva on the right is mildly erythematous.  The conjunctiva on the left is clear.  Sclerae are white bilaterally.  Extraocular movements are intact.  Oropharynx reveals no upper  teeth, gums without lesions.  The lower teeth are in poor condition with gingivitis noted.  Nasal mucosa is moist, no drainage.   NECK:  Supple.  No thyromegaly, no JVD.  LUNGS:  Clear to auscultation bilaterally.  HEART:  S1, S2, no murmurs, rubs, or gallops.  ABDOMEN:  The initial exam reveals a diffusely tender abdomen with no masses palpated, no hepatosplenomegaly, no focal tenderness.  Her abdomen was mildly distended on admission exam in the ED.  A repeat abdominal exam while the patient was in her room after the soap suds enema was given with good response revealed mildly focal tenderness of the right upper quadrant and resolution of the tenderness elsewhere.  Bowel sounds are present on both exams.  RECTAL:  Revealed a small, nonbleeding external hemorrhoid.  No intrarectal lesions palpated.  There was only a scant amount of stool in the rectal vault status post enema.  The stool was light brown and guaiac negative.  GU:  Deferred.  EXTREMITIES/MUSCULOSKELETAL:  The patient has good muscle tone and strength (in the supine position) throughout.  No obvious joint abnormalities, effusion, warmth, or edema.  There was no pretibial edema.  Pedal pulses were full and bounding bilaterally.  NEUROLOGIC:  The patient is alert and oriented x 3.  Cranial nerves II-XII intact with the exception of visual acuity and auditory acuity.  Strength in the supine position is 5/5 throughout.  Sensation of the lower extremities is intact.  ADMISSION DIAGNOSTIC DATA:   EKG #1 revealed sinus bradycardia with a rate of 55 and nonspecific ST-T wave abnormalities.  EKG #2 revealed normal sinus rhythm of 71 with mild resolution of ST-T wave abnormalities.  Chest x-ray showed no acute disease, right lower lobe atelectasis.  WBC 8.6, hemoglobin 14.2, hematocrit 43.6, MCV 86.1, platelets 245.  CK 39, CK-MB 1.3, troponin I 0.01.  Sodium 134, potassium 3.7, chloride 107, CO2 22, glucose 135, BUN 14,  creatinine 0.9, total bilirubin 0.7, alkaline phosphatase 60, SGOT 22, SGPT 15, albumin 2.9, calcium 9.3.  PT 12.7, INR 1.0, PTT 22. ABG on room air showed pH 7.4, PCO2 32.3, PO2 74.8.  Urinalysis negative. Amylase 85, lipase 22.  CT scan of the abdomen and pelvis: Preliminary findings reported as small to moderate ascites in abdomen and pelvis, questionable mild thickening of some small-bowel loops.  No free air, no small-bowel obstruction.  Questionable 14 x 7 mm filling defect post stomach.  ASSESSMENT: 1. Subjective shortness of breath on exertion.  The patient is oxygenating    well and has a clear chest x-ray.  Symptoms may be secondary to    intra-abdominal fullness, questionable ascites seen on the CT scan of the    abdomen and pelvis. 2. Bradycardia.  Resolution of bradycardia with a repeat EKG.  The patient has    a history of intermittent bradycardia in the past for which she has seen    her cardiologist.  The patients initial cardiac enzymes are negative.  She    has a history of moderate left ventricular hypertrophy by 2-D    echocardiogram in July 2001. 3. Abdominal fullness subjectively and diffuse abdominal tenderness    objectively with some localization to the right upper quadrant.  CT scan    preliminary report reveals some small to moderate amount of abdominal and    pelvic ascites.  Questionable filling defect as well. 4. Hypertension.  At this time, not well controlled.  PLAN: 1. As stated above, the patient was assessed with a CT scan of the abdomen,    amylase, and lipase secondary to the diffuse abdominal pain on exam. Given    the findings, will obtain an ultrasound of the abdomen and pelvis for    further evaluation of the ascites. 2. Surgery consult with Dr. Katrinka Blazing. 3. Lasix 20 mg x 1 IV now. 4. Soap suds enema given with good results. 5. Obtain a 2-D echocardiogram to further evaluate dyspnea on exertion and    bradycardia.  Also obtain cardiac enzymes  q.8h. x 3. 6. Obtain evaluation of TSH and free T4. 7. IV Pepcid 20 mg q.12h. and Reglan 5 mg q.a.c. and q.h.s. IV. 8. Continue antihypertensives.Dictated by:   Elliot Cousin, M.D.  Attending Physician:  Annamarie Dawley DD:  02/16/02 TD:  02/16/02 Job: [id number]

## 2011-01-25 NOTE — Discharge Summary (Signed)
NAME:  Jean Shaw, Jean Shaw          ACCOUNT NO.:  [id number]   MEDICAL RECORD NO.:  [id number]          PATIENT TYPE:  INP   LOCATION:  6710                         FACILITY:  [facility]   PHYSICIAN:  Fleet Contras, M.D.    DATE OF BIRTH:  August 01, 1929   DATE OF ADMISSION:  05/04/2007  DATE OF DISCHARGE:  05/06/2007                               DISCHARGE SUMMARY   HISTORY OF PRESENT ILLNESS:  This is the another admission for this 77-  year-old African American lady with past medical history significant for  recurrent syncope, hypertension with bradycardia, Alzheimer's dementia,  deaf and blindness.  She came to the emergency room of [facility] with the daughter reporting that she nearly passed out after  she got out of the bathtub that morning.  She did not completely pass  out.  She did not fall.  She did not sustain any injury.  She reported  having some jerky movements, but no tongue biting.  There was no report  of chest pain, shortness of breath.  No abdominal pain, no vomiting, no  diarrhea.  At the emergency room, she was noted to be in stable  condition, but her heart rate was 50.  Initial laboratory data  essentially was normal except for a pH of 7.24.  Her potassium was 15.4.  Because of the need for close monitoring and further evaluation.   HOSPITAL COURSE:  On admission, she was started on gentle IV replacement  with further potassium supplementation.  She was placed on telemetry and  a cardiology consult was requested for trouble breathing, symptomatic  bradycardia.  On previous admissions, patient was to have vent  monitoring, but she could not tolerate it.  Therefore, the cardiologist  wanted to place loop recorder, but again patient did not allow this to  be placed in the OR.  She became very agitated.  She had no further  episodes of syncope in the hospital and potassium repleted was 3.6.  On  May 06, 2007, she was considered stable for discharge home with  followup with the cardiologist in the office.   CONDITION ON DISCHARGE:  Stable.   DISCHARGE DIAGNOSES:  1. Near syncope.  2. Bradycardia.  3. Hypertension.  4. Hypokalemia, repleted.   DISCHARGE MEDICATIONS:  Her medications on discharge were the same as  her home medications and these are:  1. Aspirin 81 mg daily.  2. Aricept 10 mg daily.  3. Ibuprofen 50 mg daily.  4. Namenda 5 mg b.i.d.  5. Albuterol MDI 2 puffs q.6 p.r.n.  6. Megace 40 mg/mL, 10 mL daily.   Please note that this lady is a do not resuscitate (DNR).      Fleet Contras, M.D.  Electronically Signed     EA/MEDQ  D:  06/10/2007  T:  06/11/2007  Job:  [id number]

## 2011-06-21 LAB — DIFFERENTIAL
Basophils Relative: 0
Eosinophils Absolute: 0.1
Eosinophils Relative: 1
Neutrophils Relative %: 55

## 2011-06-21 LAB — BASIC METABOLIC PANEL
CO2: 17 — ABNORMAL LOW
CO2: 18 — ABNORMAL LOW
Calcium: 9.6
Chloride: 118 — ABNORMAL HIGH
Chloride: 119 — ABNORMAL HIGH
Creatinine, Ser: 0.69
GFR calc Af Amer: >60
GFR calc Af Amer: >60
Glucose, Bld: 92
Potassium: 3.3 — ABNORMAL LOW
Sodium: 142

## 2011-06-21 LAB — POCT I-STAT CREATININE: Creatinine, Ser: 0.8

## 2011-06-21 LAB — I-STAT 8, (EC8 V) (CONVERTED LAB)
Bicarbonate: 16.3 — ABNORMAL LOW
Glucose, Bld: 105 — ABNORMAL HIGH
HCT: 43
Hemoglobin: 14.6
Operator id: 151321
Sodium: 143
TCO2: 17
pCO2, Ven: 34.9 — ABNORMAL LOW

## 2011-06-21 LAB — CBC
HCT: 40
MCHC: 33.2
MCV: 90
Platelets: 150

## 2011-06-26 LAB — COMPREHENSIVE METABOLIC PANEL
Albumin: 2.7 — ABNORMAL LOW
Alkaline Phosphatase: 67
BUN: 14
CO2: 18 — ABNORMAL LOW
Chloride: 121 — ABNORMAL HIGH
GFR calc non Af Amer: >60
Potassium: 4
Total Bilirubin: 0.7

## 2011-06-26 LAB — CARDIAC PANEL(CRET KIN+CKTOT+MB+TROPI)
CK, MB: 0.9
Relative Index: INVALID
Total CK: 37

## 2011-06-26 LAB — CBC
Hemoglobin: 11.6 — ABNORMAL LOW
Hemoglobin: 12.1
Platelets: 150
RBC: 4.04
RDW: 13.8
WBC: 4
WBC: 5

## 2011-06-26 LAB — BASIC METABOLIC PANEL
BUN: 10
Calcium: 9.1
Calcium: 9.3
Creatinine, Ser: 0.59
GFR calc Af Amer: >60
GFR calc non Af Amer: >60
Glucose, Bld: 105 — ABNORMAL HIGH
Sodium: 141

## 2011-06-26 LAB — CORTISOL: Cortisol, Plasma: 0.9

## 2011-06-26 LAB — ACTH: C206 ACTH: <5 — ABNORMAL LOW

## 2011-06-26 LAB — D-DIMER, QUANTITATIVE: D-Dimer, Quant: 1.12 — ABNORMAL HIGH

## 2011-06-27 LAB — I-STAT 8, (EC8 V) (CONVERTED LAB)
BUN: 14
Chloride: 114 — ABNORMAL HIGH
Glucose, Bld: 67 — ABNORMAL LOW
Hemoglobin: 13.3
Potassium: 4.4
Sodium: 141

## 2011-06-27 LAB — URINE CULTURE

## 2011-06-27 LAB — CK TOTAL AND CKMB (NOT AT ARMC)
CK, MB: 1
Total CK: 67

## 2011-06-27 LAB — POCT I-STAT CREATININE: Creatinine, Ser: 0.8

## 2011-06-27 LAB — URINALYSIS, ROUTINE W REFLEX MICROSCOPIC
Bilirubin Urine: NEGATIVE
Nitrite: NEGATIVE
Specific Gravity, Urine: 1.014
pH: 7.5

## 2011-06-27 LAB — CARDIAC PANEL(CRET KIN+CKTOT+MB+TROPI): Relative Index: INVALID

## 2013-04-12 ENCOUNTER — Inpatient Hospital Stay (HOSPITAL_COMMUNITY)
Admission: EM | Admit: 2013-04-12 | Discharge: 2013-04-19 | DRG: 871 | Disposition: A | Discharge status: Home Health | Payer: Medicare Other | Attending: Internal Medicine | Admitting: Internal Medicine

## 2013-04-12 ENCOUNTER — Emergency Department (HOSPITAL_COMMUNITY): Payer: Medicare Other

## 2013-04-12 ENCOUNTER — Inpatient Hospital Stay (HOSPITAL_COMMUNITY): Payer: Medicare Other

## 2013-04-12 ENCOUNTER — Encounter (HOSPITAL_COMMUNITY): Payer: Self-pay

## 2013-04-12 DIAGNOSIS — Z515 Encounter for palliative care: Secondary | ICD-10-CM

## 2013-04-12 DIAGNOSIS — H9190 Unspecified hearing loss, unspecified ear: Secondary | ICD-10-CM | POA: Diagnosis present

## 2013-04-12 DIAGNOSIS — R6520 Severe sepsis without septic shock: Secondary | ICD-10-CM | POA: Diagnosis present

## 2013-04-12 DIAGNOSIS — R319 Hematuria, unspecified: Secondary | ICD-10-CM | POA: Diagnosis present

## 2013-04-12 DIAGNOSIS — G9341 Metabolic encephalopathy: Secondary | ICD-10-CM

## 2013-04-12 DIAGNOSIS — Z66 Do not resuscitate: Secondary | ICD-10-CM | POA: Diagnosis present

## 2013-04-12 DIAGNOSIS — K5900 Constipation, unspecified: Secondary | ICD-10-CM | POA: Diagnosis present

## 2013-04-12 DIAGNOSIS — N390 Urinary tract infection, site not specified: Secondary | ICD-10-CM

## 2013-04-12 DIAGNOSIS — F0390 Unspecified dementia without behavioral disturbance: Secondary | ICD-10-CM

## 2013-04-12 DIAGNOSIS — D649 Anemia, unspecified: Secondary | ICD-10-CM

## 2013-04-12 DIAGNOSIS — Z7401 Bed confinement status: Secondary | ICD-10-CM

## 2013-04-12 DIAGNOSIS — I10 Essential (primary) hypertension: Secondary | ICD-10-CM

## 2013-04-12 DIAGNOSIS — Z87440 Personal history of urinary (tract) infections: Secondary | ICD-10-CM

## 2013-04-12 DIAGNOSIS — E872 Acidosis, unspecified: Secondary | ICD-10-CM | POA: Diagnosis present

## 2013-04-12 DIAGNOSIS — K5649 Other impaction of intestine: Secondary | ICD-10-CM | POA: Diagnosis present

## 2013-04-12 DIAGNOSIS — R918 Other nonspecific abnormal finding of lung field: Secondary | ICD-10-CM

## 2013-04-12 DIAGNOSIS — Z87891 Personal history of nicotine dependence: Secondary | ICD-10-CM

## 2013-04-12 DIAGNOSIS — A419 Sepsis, unspecified organism: Principal | ICD-10-CM

## 2013-04-12 DIAGNOSIS — Z681 Body mass index (BMI) 19 or less, adult: Secondary | ICD-10-CM

## 2013-04-12 DIAGNOSIS — I959 Hypotension, unspecified: Secondary | ICD-10-CM

## 2013-04-12 DIAGNOSIS — H548 Legal blindness, as defined in USA: Secondary | ICD-10-CM | POA: Diagnosis present

## 2013-04-12 DIAGNOSIS — K560 Paralytic ileus: Secondary | ICD-10-CM | POA: Diagnosis present

## 2013-04-12 DIAGNOSIS — E43 Unspecified severe protein-calorie malnutrition: Secondary | ICD-10-CM

## 2013-04-12 DIAGNOSIS — J690 Pneumonitis due to inhalation of food and vomit: Secondary | ICD-10-CM | POA: Diagnosis present

## 2013-04-12 DIAGNOSIS — R64 Cachexia: Secondary | ICD-10-CM | POA: Diagnosis present

## 2013-04-12 HISTORY — DX: Urinary tract infection, site not specified: N39.0

## 2013-04-12 HISTORY — DX: Unspecified osteoarthritis, unspecified site: M19.90

## 2013-04-12 HISTORY — DX: Dementia in other diseases classified elsewhere, unspecified severity, without behavioral disturbance, psychotic disturbance, mood disturbance, and anxiety: F02.80

## 2013-04-12 HISTORY — DX: Essential (primary) hypertension: I10

## 2013-04-12 HISTORY — DX: Unspecified hearing loss, bilateral: H91.93

## 2013-04-12 HISTORY — DX: Constipation, unspecified: K59.00

## 2013-04-12 HISTORY — DX: Personal history of other medical treatment: Z92.89

## 2013-04-12 HISTORY — DX: Unspecified glaucoma: H40.9

## 2013-04-12 HISTORY — DX: Alzheimer's disease, unspecified: G30.9

## 2013-04-12 HISTORY — DX: Legal blindness, as defined in USA: H54.8

## 2013-04-12 LAB — CBC
HCT: 22.5 % — ABNORMAL LOW (ref 36.0–46.0)
MCHC: 31.6 g/dL (ref 30.0–36.0)
Platelets: 196 10*3/uL (ref 150–400)
RDW: 15.9 % — ABNORMAL HIGH (ref 11.5–15.5)
WBC: 7.8 10*3/uL (ref 4.0–10.5)

## 2013-04-12 LAB — URINALYSIS, ROUTINE W REFLEX MICROSCOPIC
Glucose, UA: NEGATIVE mg/dL
Protein, ur: >300 mg/dL — AB
pH: 8 (ref 5.0–8.0)

## 2013-04-12 LAB — COMPREHENSIVE METABOLIC PANEL
Alkaline Phosphatase: 57 U/L (ref 39–117)
BUN: 14 mg/dL (ref 6–23)
CO2: 23 mEq/L (ref 19–32)
Chloride: 101 mEq/L (ref 96–112)
GFR calc Af Amer: >90 mL/min (ref >90)
GFR calc non Af Amer: 81 mL/min — ABNORMAL LOW (ref >90)
Glucose, Bld: 108 mg/dL — ABNORMAL HIGH (ref 70–99)
Potassium: 4.3 mEq/L (ref 3.5–5.1)
Total Bilirubin: 0.3 mg/dL (ref 0.3–1.2)
Total Protein: 6 g/dL (ref 6.0–8.3)

## 2013-04-12 LAB — POCT I-STAT, CHEM 8
Chloride: 103 mEq/L (ref 96–112)
Glucose, Bld: 106 mg/dL — ABNORMAL HIGH (ref 70–99)
HCT: 28 % — ABNORMAL LOW (ref 36.0–46.0)
Hemoglobin: 9.5 g/dL — ABNORMAL LOW (ref 12.0–15.0)
Potassium: 4.4 mEq/L (ref 3.5–5.1)
Sodium: 135 mEq/L (ref 135–145)

## 2013-04-12 LAB — MRSA PCR SCREENING: MRSA by PCR: NEGATIVE

## 2013-04-12 LAB — URINE MICROSCOPIC-ADD ON

## 2013-04-12 LAB — OCCULT BLOOD, POC DEVICE: Fecal Occult Bld: NEGATIVE

## 2013-04-12 LAB — PROTIME-INR: Prothrombin Time: 15.2 seconds (ref 11.6–15.2)

## 2013-04-12 LAB — CG4 I-STAT (LACTIC ACID): Lactic Acid, Venous: 3.2 mmol/L — ABNORMAL HIGH (ref 0.5–2.2)

## 2013-04-12 LAB — APTT: aPTT: 25 seconds (ref 24–37)

## 2013-04-12 LAB — GLUCOSE, CAPILLARY: Glucose-Capillary: 119 mg/dL — ABNORMAL HIGH (ref 70–99)

## 2013-04-12 MED ORDER — BRIMONIDINE TARTRATE 0.2 % OP SOLN
1.0000 [drp] | Freq: Two times a day (BID) | OPHTHALMIC | Status: DC
  Administered 2013-04-12 – 2013-04-19 (×14): 1 [drp] via OPHTHALMIC
  Filled 2013-04-12 (×2): qty 5

## 2013-04-12 MED ORDER — DONEPEZIL HCL 10 MG PO TABS
10.0000 mg | ORAL_TABLET | Freq: Every day | ORAL | Status: DC
  Administered 2013-04-12 – 2013-04-18 (×7): 10 mg via ORAL
  Filled 2013-04-12 (×11): qty 1

## 2013-04-12 MED ORDER — SODIUM CHLORIDE 0.9 % IV BOLUS (SEPSIS)
1000.0000 mL | Freq: Once | INTRAVENOUS | Status: AC
  Administered 2013-04-12: 1000 mL via INTRAVENOUS

## 2013-04-12 MED ORDER — DEXTROSE 5 % IV SOLN: 1.0000 g | INTRAVENOUS | Status: DC

## 2013-04-12 MED ORDER — SODIUM CHLORIDE 0.9 % IV SOLN
INTRAVENOUS | Status: DC
  Filled 2013-04-12: qty 1000

## 2013-04-12 MED ORDER — IOHEXOL 300 MG/ML  SOLN
80.0000 mL | Freq: Once | INTRAMUSCULAR | Status: AC | PRN
  Administered 2013-04-12: 80 mL via INTRAVENOUS

## 2013-04-12 MED ORDER — IOHEXOL 300 MG/ML  SOLN
50.0000 mL | Freq: Once | INTRAMUSCULAR | Status: AC | PRN
  Administered 2013-04-12: 50 mL via ORAL

## 2013-04-12 MED ORDER — ENSURE COMPLETE PO LIQD
237.0000 mL | Freq: Two times a day (BID) | ORAL | Status: DC
  Administered 2013-04-12 – 2013-04-19 (×10): 237 mL via ORAL

## 2013-04-12 MED ORDER — ACETAMINOPHEN 325 MG PO TABS
650.0000 mg | ORAL_TABLET | Freq: Four times a day (QID) | ORAL | Status: DC | PRN
  Administered 2013-04-18: 650 mg via ORAL
  Filled 2013-04-12 (×2): qty 2

## 2013-04-12 MED ORDER — BRIMONIDINE TARTRATE 0.15 % OP SOLN: 1.0000 [drp] | Freq: Two times a day (BID) | OPHTHALMIC | Status: DC

## 2013-04-12 MED ORDER — BIOTENE DRY MOUTH MT LIQD
15.0000 mL | Freq: Two times a day (BID) | OROMUCOSAL | Status: DC
  Administered 2013-04-12 – 2013-04-19 (×14): 15 mL via OROMUCOSAL

## 2013-04-12 MED ORDER — VANCOMYCIN HCL IN DEXTROSE 750-5 MG/150ML-% IV SOLN
750.0000 mg | INTRAVENOUS | Status: DC
  Administered 2013-04-12 – 2013-04-19 (×8): 750 mg via INTRAVENOUS
  Filled 2013-04-12 (×8): qty 150

## 2013-04-12 MED ORDER — CEFTRIAXONE SODIUM 1 G IJ SOLR
1.0000 g | Freq: Once | INTRAMUSCULAR | Status: AC
Start: 2013-04-12 — End: 2013-04-12
  Administered 2013-04-12: 1 g via INTRAVENOUS
  Filled 2013-04-12: qty 10

## 2013-04-12 MED ORDER — POLYETHYLENE GLYCOL 3350 17 G PO PACK
17.0000 g | PACK | Freq: Every day | ORAL | Status: DC | PRN
  Administered 2013-04-13: 17 g via ORAL
  Filled 2013-04-12: qty 1

## 2013-04-12 MED ORDER — SODIUM CHLORIDE 0.9 % IJ SOLN
3.0000 mL | Freq: Two times a day (BID) | INTRAMUSCULAR | Status: DC
  Administered 2013-04-12 – 2013-04-18 (×6): 3 mL via INTRAVENOUS

## 2013-04-12 MED ORDER — BISACODYL 10 MG RE SUPP
10.0000 mg | Freq: Once | RECTAL | Status: AC
  Administered 2013-04-12: 10 mg via RECTAL
  Filled 2013-04-12: qty 1

## 2013-04-12 MED ORDER — ASPIRIN EC 81 MG PO TBEC
81.0000 mg | DELAYED_RELEASE_TABLET | Freq: Every day | ORAL | Status: DC
  Administered 2013-04-12 – 2013-04-13 (×2): 81 mg via ORAL
  Filled 2013-04-12 (×2): qty 1

## 2013-04-12 MED ORDER — ONDANSETRON HCL 4 MG/2ML IJ SOLN: 4.0000 mg | Freq: Four times a day (QID) | INTRAMUSCULAR | Status: DC | PRN

## 2013-04-12 MED ORDER — DOCUSATE SODIUM 100 MG PO CAPS: 100.0000 mg | ORAL_CAPSULE | Freq: Two times a day (BID) | ORAL | Status: DC | PRN

## 2013-04-12 MED ORDER — ONDANSETRON HCL 4 MG PO TABS: 4.0000 mg | ORAL_TABLET | Freq: Four times a day (QID) | ORAL | Status: DC | PRN

## 2013-04-12 MED ORDER — SODIUM CHLORIDE 0.9 % IV SOLN: INTRAVENOUS | Status: DC

## 2013-04-12 MED ORDER — SODIUM CHLORIDE 0.9 % IV SOLN
INTRAVENOUS | Status: AC
  Administered 2013-04-12: 125 mL/h via INTRAVENOUS

## 2013-04-12 MED ORDER — DOCUSATE SODIUM 100 MG PO CAPS
100.0000 mg | ORAL_CAPSULE | Freq: Two times a day (BID) | ORAL | Status: DC
  Filled 2013-04-12: qty 1

## 2013-04-12 MED ORDER — PIPERACILLIN-TAZOBACTAM 3.375 G IVPB 30 MIN
3.3750 g | Freq: Three times a day (TID) | INTRAVENOUS | Status: DC
  Administered 2013-04-12 – 2013-04-18 (×20): 3.375 g via INTRAVENOUS
  Filled 2013-04-12 (×21): qty 50

## 2013-04-12 MED ORDER — ACETAMINOPHEN 650 MG RE SUPP
650.0000 mg | Freq: Four times a day (QID) | RECTAL | Status: DC | PRN
  Administered 2013-04-12: 650 mg via RECTAL
  Filled 2013-04-12 (×2): qty 1

## 2013-04-12 MED ORDER — ENOXAPARIN SODIUM 30 MG/0.3ML ~~LOC~~ SOLN
30.0000 mg | SUBCUTANEOUS | Status: DC
  Administered 2013-04-12 – 2013-04-19 (×8): 30 mg via SUBCUTANEOUS
  Filled 2013-04-12 (×8): qty 0.3

## 2013-04-12 MED ORDER — ENOXAPARIN SODIUM 40 MG/0.4ML ~~LOC~~ SOLN
40.0000 mg | SUBCUTANEOUS | Status: DC
  Filled 2013-04-12: qty 0.4

## 2013-04-12 NOTE — ED Notes (Signed)
Admitting physician at the bedside to speak with daughter

## 2013-04-12 NOTE — ED Notes (Signed)
Pt drinking PO contrast

## 2013-04-12 NOTE — ED Notes (Signed)
This nurse spoke with EDP pickering regarding possible code sepsis and its associated protocols. Rubin Payor stated he would go evaluate the patient.

## 2013-04-12 NOTE — Progress Notes (Signed)
INITIAL NUTRITION ASSESSMENT  DOCUMENTATION CODES Per approved criteria  -Severe malnutrition in the context of chronic illness -Underweight   INTERVENTION:  Ensure Complete twice daily (350 kcals, 13 gm protein per 8 fl oz bottle) RD to follow for nutrition care plan  NUTRITION DIAGNOSIS: Increased nutrient needs related to acute illness, malnutrition as evidenced by estimated nutrition needs  Goal: Patient to meet >/= 90% of their estimated nutrition needs   Monitor:  PO & supplemental intake, weight, labs, I/O's  Reason for Assessment: Low Braden  79 y.o. female  Admitting Dx: Sepsis with metabolic encephalopathy  ASSESSMENT: Patient with PMH of dementia, HTN, chronic UTI on abx, brought in by daughter as she appeared febrile and poorly responsive.  RD spoke with patient's daughter who reports her Mom lives with her and drinks Ensure supplements at home; she feeds her smaller meals throughout the day vs 3 "bigger" meals; patient with visible muscle loss and fat loss to upper body; patient at risk for skin breakdown given low braden score.  Patient meets criteria for severe malnutrition in the context of chronic illness as evidenced by severe muscle loss (temples, clavicles, deltoids) and severe subcutaneous fat loss (triceps/biceps).  Height: Ht Readings from Last 1 Encounters:  04/12/13 5\' 5"  (1.651 m)    Weight: Wt Readings from Last 1 Encounters:  04/12/13 93 lb 14.7 oz (42.6 kg)    Ideal Body Weight: 125 lb  % Ideal Body Weight: 74%  Wt Readings from Last 10 Encounters:  04/12/13 93 lb 14.7 oz (42.6 kg)    Usual Body Weight: ---  % Usual Body Weight: ---  BMI:  Body mass index is 15.63 kg/(m^2).  Estimated Nutritional Needs: Kcal: 1200-1400 Protein: 60-70 gm Fluid: >/= 1.5 L  Skin: Intact  Diet Order: Dysphagia 1, thin liquids  EDUCATION NEEDS: -No education needs identified at this time   Intake/Output Summary (Last 24 hours) at 04/12/13  1151 Last data filed at 04/12/13 1100  Gross per 24 hour  Intake 1437.5 ml  Output      0 ml  Net 1437.5 ml    Labs:   Recent Labs Lab 04/12/13 0517 04/12/13 0627  NA 133* 135  K 4.3 4.4  CL 101 103  CO2 23  --   BUN 14 13  CREATININE 0.64 0.80  CALCIUM 8.6  --   GLUCOSE 108* 106*    CBG (last 3)   Recent Labs  04/12/13 0525  GLUCAP 119*    Scheduled Meds: . sodium chloride   Intravenous STAT  . aspirin EC  81 mg Oral Daily  . brimonidine  1 drop Both Eyes BID  . docusate sodium  100 mg Oral BID  . donepezil  10 mg Oral QHS  . enoxaparin (LOVENOX) injection  30 mg Subcutaneous Q24H  . feeding supplement  237 mL Oral BID BM  . piperacillin-tazobactam  3.375 g Intravenous Q8H  . sodium chloride  3 mL Intravenous Q12H  . vancomycin  750 mg Intravenous Q24H    Continuous Infusions: . sodium chloride 0.9 % 1,000 mL infusion      Past Medical History  Diagnosis Date  . UTI (urinary tract infection)   . Glaucoma   . Hypertension   . Constipation   . Dementia   . Blind in both eyes   . Hearing loss in right ear     History reviewed. No pertinent past surgical history.  Maureen Chatters, RD, LDN Pager #: [phone] After-Hours Pager #: [phone]

## 2013-04-12 NOTE — H&P (Addendum)
[facility] History and Physical  Jean Shaw WUJ:[id number] DOB: Jun 04, 1929 DOA: 04/12/2013  Referring physician: Dr Rubin Payor PCP: Dr Florentina Jenny, MD  Chief Complaint:  Fever with Altered mental status x 1 day  HPI:  History obtained from daughter as patient is severely demented  79 y/o female with dementia, HTN, renals tones, chronic UTI on abx, brought in by daughter as she appeared febrile this morning and had poor responsiveness associated with some chills. Temp at home was 64F..she also noticed blood in her diapers this morning.  Patient was at her baseline until this morning. At baseline she has moderate to severe dementia , is able to carry out conversations mostly at yes or no, fully bedbound and dependent with all her ADLs, awake and alert , has hearing impairment and legally blind.  Daughter called EMS and was brought to the hospital. She had no nausea, vomiting, diarrhea, shortness of breath , c/o pain anywhere, recent fall , poor appetite or worsening mental status until his morning.   Course in the ED Patient was febrile to 100.26F. BP was in 80s so given 1 L NS bolus and placed on NS @125  cc / hr after which it improved. Labs showed hb of 7.5 . UA suggestive of UTI with hematuria. Lactic acid was elevated, but no anion gap noted.  CXR showed multiple lung nodules concerning for metastatic disease. Patient given 1 dose IV rocephin and slowly started to respond close to her baseline. A CT chest , abd and pelvis was ordered and [facility] called for admission to stepdown given sepsis.     Review of Systems:  As outlined in HPI. Patient severely demented   Past Medical History  Diagnosis Date  . UTI (urinary tract infection)   . Glaucoma   . Hypertension   . Constipation   . Dementia   . Blind in both eyes   . Hearing loss in right ear    History reviewed. No pertinent past surgical history. Social History:  reports that she has never smoked.  She does not have any smokeless tobacco history on file. She reports that she does not drink alcohol or use illicit drugs.  No Known Allergies  No family history on file.  Prior to Admission medications   Medication Sig Start Date End Date Taking? Authorizing Provider  aspirin EC 81 MG tablet Take 81 mg by mouth daily.   Yes Historical Provider, MD  brimonidine (ALPHAGAN) 0.15 % ophthalmic solution Place 1 drop into both eyes 2 (two) times daily.   Yes Historical Provider, MD  docusate sodium (COLACE) 50 MG capsule Take 100 mg by mouth 2 (two) times daily.   Yes Historical Provider, MD  donepezil (ARICEPT) 10 MG tablet Take 10 mg by mouth at bedtime.   Yes Historical Provider, MD  losartan (COZAAR) 25 MG tablet Take 12.5 mg by mouth daily.   Yes Historical Provider, MD  nitrofurantoin (MACRODANTIN) 50 MG capsule Take 50 mg by mouth at bedtime.   Yes Historical Provider, MD  polyethylene glycol (MIRALAX / GLYCOLAX) packet Take 17 g by mouth daily as needed (for constipation).   Yes Historical Provider, MD    Physical Exam:  Filed Vitals:   04/12/13 0558 04/12/13 0600 04/12/13 0700 04/12/13 0742  BP: 104/41 104/41 90/46 121/57  Pulse: 51 53 89   Temp:      TempSrc:      Resp:  15 13 12   SpO2:  100% 100% 100%    Constitutional: Vital  signs reviewed.  Thin built elderly female lying in bed poorly communicative HEENT: no pallor, dry mucosa Chest: clear b/l, o added sounds  CVS: NS1&S2, no murmurs, rubs or gallop ABD: soft, NT, ND, BS+  EXT: warm, no edema  cns: aaox0, poorly communicative.  Labs on Admission:  Basic Metabolic Panel:  Recent Labs Lab 04/12/13 0517 04/12/13 0627  NA 133* 135  K 4.3 4.4  CL 101 103  CO2 23  --   GLUCOSE 108* 106*  BUN 14 13  CREATININE 0.64 0.80  CALCIUM 8.6  --    Liver Function Tests:  Recent Labs Lab 04/12/13 0517  AST 14  ALT 5  ALKPHOS 57  BILITOT 0.3  PROT 6.0  ALBUMIN 2.1*   No results found for this basename: LIPASE,  AMYLASE,  in the last 168 hours No results found for this basename: AMMONIA,  in the last 168 hours CBC:  Recent Labs Lab 04/12/13 0540 04/12/13 0627  WBC 7.8  --   HGB 7.1* 9.5*  HCT 22.5* 28.0*  MCV 74.3*  --   PLT 196  --    Cardiac Enzymes: No results found for this basename: CKTOTAL, CKMB, CKMBINDEX, TROPONINI,  in the last 168 hours BNP: No components found with this basename: POCBNP,  CBG:  Recent Labs Lab 04/12/13 0525  GLUCAP 119*    Radiological Exams on Admission: Dg Chest Port 1 View  04/12/2013   *RADIOLOGY REPORT*  Clinical Data: Altered mental status.  PORTABLE CHEST - 1 VIEW  Comparison: CT chest 02/24/2007 and single view of the chest 03/27/2010.  Findings: Multiple bilateral nodular opacities are identified.  One of the largest is in the left upper lobe and measures 1.4 cm in diameter.  No consolidative process, pneumothorax or effusion is seen.  No focal bony abnormality is identified.  IMPRESSION: Bilateral pulmonary nodules worrisome for metastatic disease.   Original Report Authenticated By: Holley Dexter, M.D.       Assessment/Plan Principal Problem:   Sepsis with metabolic encephalopathy Hypotension, fever and lactic acidosis meeting criteria for sepsis Likely secondary to UTI. Daughter reports patient had good appetite until yesterday. Admit to stepdown IV hydration with NS.  neurochecks  will empirically cover with IV vanco and zosyn given CT findings outlined below. Follow urine cx, ordered blood cx  Active Problems: Lung nodules  seen on CXR concerning  for mets. Daughter reports hx of smoking in past. CT chest showed multiple bilateral lung nodules suggestive of  mets vs ? Septic emboli. Also showed distal esophageal mass. Discussed with daughter . Does not want any intervention. Given her advanced dementia and poor functional status , she is likely a poor candidate for any treatment.  CT abd and pelvis shows some colonic ileus and      Dementia Severe. bed bound with blindness and hearing impairment. Recognizes daughter and her grandson but not abel to carry out full conversations. Fully dependent with ADLs    UTI (lower urinary tract infection) Has hx of chronic UTI and is on nitrofurantoin at home. CT suggests pyelonephritis of left kidney . Also  Monitor I/O  Severe constipation   has rectal impaction with colonic ileus. Will order suppository and enema.    Hypertension Hold BP meds    Hematuria Has hx of kidney stones . Has a nonobstructive.  Left renal calculus.  Monitor H&H. Will plan on further w/up depending upon CT result    Anemia  given 1 u PRBC in ED. Hb improved  to 9.5. monitor  DVT prophylaxis: sq lovenox  Code Status: DNR Family Communication: daugther at bedside. Discussed goals of care. Does not want intervention for biopsy at this time and understands she is not a good candidate for treatment if lesions are cancerous. Wishes to have her treated for infection , IV fluids , transfusions and monitor.   Disposition Plan: pending  Eddie North [facility] Pager [phone]  If 7PM-7AM, please contact night-coverage [email or web address] Password [facility] 04/12/2013, 8:26 AM  Total time spent: 70 minutes

## 2013-04-12 NOTE — ED Notes (Signed)
Pt finished drinking 1st cup of PO contrast.

## 2013-04-12 NOTE — ED Notes (Signed)
Pt is unable to stand for orthostatic blood pressure. The tech has reported to RN in charge.

## 2013-04-12 NOTE — ED Notes (Signed)
Report given to Eulah Citizen, RN on 3300. RN to start blood on floor.

## 2013-04-12 NOTE — Progress Notes (Signed)
Patient received to room 3302 from ED. Patient is HOH and legally blind. Incontinent of large amt. Of pasty stool and incontinent of bloody urine. Vitals obtained, daughter at bedside. Consent signed for blood transfusion.Bed alarm is on.

## 2013-04-12 NOTE — ED Notes (Signed)
Pt from home, was laying in bed and went to sit up but reportedly passed out. Pt did not fall, no head injury. EMS reports patient lethargic, only oriented to voice. Pt also on abx for UTI. EMS also reports blood in pts brief. VS: BP-90/40, HR-48, RR-20, 98% RA, CBG-126. Hx of HTN.

## 2013-04-12 NOTE — Progress Notes (Signed)
Utilization review completed.  

## 2013-04-12 NOTE — Progress Notes (Signed)
Have notified Dr. Gonzella Lex of the patient's temp. Of 100 orally while receiving 1st unit of blood. MD said to continue transfusion.

## 2013-04-12 NOTE — ED Provider Notes (Addendum)
CSN: [id number]     Arrival date & time 04/12/13  0507 History     First MD Initiated Contact with Patient 04/12/13 [phone]     Chief Complaint  Patient presents with  . Loss of Consciousness  . Rectal Bleeding   level V caveat due to altered mental status and baseline dementia. (Consider location/radiation/quality/duration/timing/severity/associated sxs/prior Treatment) The history is provided by the patient, a relative and the EMS personnel.   patient was brought in for possible near syncope and blood in her diaper. Found to have pressure in the 80s. She had blood in her diaper here. Patient is not able to give a history. Patient's daughter later arrived and states that she's been on chronic antibiotics for a UTI. States that she found blood in the diaper after patient almost passed out. Patient is a DO NOT RESUSCITATE  History reviewed. No pertinent past medical history. History reviewed. No pertinent past surgical history. No family history on file. History  Substance Use Topics  . Smoking status: Not on file  . Smokeless tobacco: Not on file  . Alcohol Use: Not on file   OB History   Grav Para Term Preterm Abortions TAB SAB Ect Mult Living                 Review of Systems  Unable to perform ROS: Mental status change    Allergies  Review of patient's allergies indicates no known allergies.  Home Medications   Current Outpatient Rx  Name  Route  Sig  Dispense  Refill  . aspirin EC 81 MG tablet   Oral   Take 81 mg by mouth daily.         . brimonidine (ALPHAGAN) 0.15 % ophthalmic solution   Both Eyes   Place 1 drop into both eyes 2 (two) times daily.         Marland Kitchen docusate sodium (COLACE) 50 MG capsule   Oral   Take 100 mg by mouth 2 (two) times daily.         Marland Kitchen donepezil (ARICEPT) 10 MG tablet   Oral   Take 10 mg by mouth at bedtime.         Marland Kitchen losartan (COZAAR) 25 MG tablet   Oral   Take 12.5 mg by mouth daily.         . nitrofurantoin (MACRODANTIN)  50 MG capsule   Oral   Take 50 mg by mouth at bedtime.         . polyethylene glycol (MIRALAX / GLYCOLAX) packet   Oral   Take 17 g by mouth daily as needed (for constipation).          BP 121/57  Pulse 89  Temp(Src) 100.7 F (38.2 C) (Rectal)  Resp 12  SpO2 100% Physical Exam  Constitutional: She appears well-developed and well-nourished.  Cachectic  HENT:  Head: Normocephalic.  Eyes:  Bilateral corneas opaque  Cardiovascular:  Bradycardia  Abdominal: There is no tenderness.  Musculoskeletal: She exhibits no edema.  Neurological:  No response to commands. Some withdraw from pain.  Skin: No rash noted. There is pallor.    ED Course   Procedures (including critical care time)  Labs Reviewed  COMPREHENSIVE METABOLIC PANEL - Abnormal; Notable for the following:    Sodium 133 (*)    Glucose, Bld 108 (*)    Albumin 2.1 (*)    GFR calc non Af Amer 81 (*)    All other components within normal limits  URINALYSIS, ROUTINE W REFLEX MICROSCOPIC - Abnormal; Notable for the following:    Color, Urine RED (*)    APPearance TURBID (*)    Hgb urine dipstick LARGE (*)    Bilirubin Urine SMALL (*)    Protein, ur >300 (*)    Nitrite POSITIVE (*)    Leukocytes, UA MODERATE (*)    All other components within normal limits  GLUCOSE, CAPILLARY - Abnormal; Notable for the following:    Glucose-Capillary 119 (*)    All other components within normal limits  CBC - Abnormal; Notable for the following:    RBC 3.03 (*)    Hemoglobin 7.1 (*)    HCT 22.5 (*)    MCV 74.3 (*)    MCH 23.4 (*)    RDW 15.9 (*)    All other components within normal limits  URINE MICROSCOPIC-ADD ON - Abnormal; Notable for the following:    Bacteria, UA MANY (*)    All other components within normal limits  CG4 I-STAT (LACTIC ACID) - Abnormal; Notable for the following:    Lactic Acid, Venous 3.20 (*)    All other components within normal limits  POCT I-STAT, CHEM 8 - Abnormal; Notable for the  following:    Glucose, Bld 106 (*)    Hemoglobin 9.5 (*)    HCT 28.0 (*)    All other components within normal limits  URINE CULTURE  PROTIME-INR  APTT  OCCULT BLOOD, POC DEVICE  TYPE AND SCREEN  PREPARE RBC (CROSSMATCH)  ABO/RH   Dg Chest Port 1 View  04/12/2013   *RADIOLOGY REPORT*  Clinical Data: Altered mental status.  PORTABLE CHEST - 1 VIEW  Comparison: CT chest 02/24/2007 and single view of the chest 03/27/2010.  Findings: Multiple bilateral nodular opacities are identified.  One of the largest is in the left upper lobe and measures 1.4 cm in diameter.  No consolidative process, pneumothorax or effusion is seen.  No focal bony abnormality is identified.  IMPRESSION: Bilateral pulmonary nodules worrisome for metastatic disease.   Original Report Authenticated By: Holley Dexter, M.D.   1. Hematuria   2. Anemia   3. Hypotension   4. Multiple lung nodules   5. UTI (urinary tract infection)    CRITICAL CARE Performed by: Billee Cashing Total critical care time: 30 Critical care time was exclusive of separately billable procedures and treating other patients. Critical care was necessary to treat or prevent imminent or life-threatening deterioration. Critical care was time spent personally by me on the following activities: development of treatment plan with patient and/or surrogate as well as nursing, discussions with consultants, evaluation of patient's response to treatment, examination of patient, obtaining history from patient or surrogate, ordering and performing treatments and interventions, ordering and review of laboratory studies, ordering and review of radiographic studies, pulse oximetry and re-evaluation of patient's condition. MDM  Patient with anemia and hypotension. Pressure initially improved with some fluids but later decreased again. Hemoglobin is 7 and his frank blood in the urine. Chest x-ray was done and showed possible metastatic cancer. Patient has no known  primary. She has low-grade temperature and possible urinary tract infection also. Antibiotics given and culture was sent. Patient will be given blood since her blood pressure decreased again. She'll be admitted to internal medicine. CT scans will be done to 2 bleeding and defined possible primary for the nodules in the lungs.  Juliet Rude. Rubin Payor, MD 04/12/13 1478  Juliet Rude Rubin Payor, MD 04/12/13 [phone]

## 2013-04-12 NOTE — Progress Notes (Signed)
ANTIBIOTIC CONSULT NOTE - INITIAL  Pharmacy Consult for Vancomycin Indication: r/o sepsis  No Known Allergies  Patient Measurements: Height: 5\' 5"  (165.1 cm) Weight: 93 lb 14.7 oz (42.6 kg) IBW/kg (Calculated) : 57 Adjusted Body Weight:   Vital Signs: Temp: 97.6 F (36.4 C) (08/04 0840) Temp src: Axillary (08/04 0840) BP: 142/61 mmHg (08/04 0840) Pulse Rate: 89 (08/04 0700) Intake/Output from previous day:   Intake/Output from this shift: Total I/O In: 1050 [I.V.:1050] Out: -   Labs:  Recent Labs  04/12/13 0517 04/12/13 0540 04/12/13 0627  WBC  --  7.8  --   HGB  --  7.1* 9.5*  PLT  --  196  --   CREATININE 0.64  --  0.80   Estimated Creatinine Clearance: 37.1 ml/min (by C-G formula based on Cr of 0.8). No results found for this basename: VANCOTROUGH, VANCOPEAK, VANCORANDOM, GENTTROUGH, GENTPEAK, GENTRANDOM, TOBRATROUGH, TOBRAPEAK, TOBRARND, AMIKACINPEAK, AMIKACINTROU, AMIKACIN,  in the last 72 hours   Microbiology: No results found for this or any previous visit (from the past 720 hour(s)).  Medical History: Past Medical History  Diagnosis Date  . UTI (urinary tract infection)   . Glaucoma   . Hypertension   . Constipation   . Dementia   . Blind in both eyes   . Hearing loss in right ear     Medications:  Prescriptions prior to admission  Medication Sig Dispense Refill  . aspirin EC 81 MG tablet Take 81 mg by mouth daily.      . brimonidine (ALPHAGAN) 0.15 % ophthalmic solution Place 1 drop into both eyes 2 (two) times daily.      Marland Kitchen docusate sodium (COLACE) 50 MG capsule Take 100 mg by mouth 2 (two) times daily.      Marland Kitchen donepezil (ARICEPT) 10 MG tablet Take 10 mg by mouth at bedtime.      Marland Kitchen losartan (COZAAR) 25 MG tablet Take 12.5 mg by mouth daily.      . nitrofurantoin (MACRODANTIN) 50 MG capsule Take 50 mg by mouth at bedtime.      . polyethylene glycol (MIRALAX / GLYCOLAX) packet Take 17 g by mouth daily as needed (for constipation).        Assessment: 83yof to start Vancomycin and Zosyn for possible sepsis most likely from urinary source (UTI/Pyelonephritis). Patient has received Ceftriaxone x 1 dose in ED but no other antibiotics have been given. - Tmax 100.7, WBC wnl - CrCl ~37 ml/min - Weight: 42.6kg  Goal of Therapy:  Vancomycin trough level 15-20 mcg/ml  Plan:  1. Vancomycin 750mg  IV q24h 2. Continue Zosyn 3.375g IV q8h - infuse over 4 hours 3. Monitor renal function, cultures, clinical course and order trough at [facility] [phone] 04/12/2013,9:41 AM

## 2013-04-12 NOTE — ED Notes (Addendum)
Per pt's daughter, pt's daughter has cared for her mother since 2007, daughter reports that she woke her mother up at 0400 this morning to give her a glass of water and she would not respond to her, daughter states that her mother felt warm and her pillow was wet so she took her temperature and it was 99.1 degrees F, daughter reports she sat her mother up in a chair and that is when she soiled her diaper with red loose stools, daughter then called EMS. Daughter states mother is more alert now in the ED than she was at home, daughter states if her mother was acting herself she would not be okay with all these wires on her. Per pt's daughter, pt was her neurological normal yesterday and did not notice any differences in her neuro status until this morning at 0400. Pt is a DNR. Daughter at bedside. Pt's daughter also reports the pt's urine has blood in it.

## 2013-04-13 ENCOUNTER — Encounter (HOSPITAL_COMMUNITY): Payer: Self-pay | Admitting: General Practice

## 2013-04-13 DIAGNOSIS — F0390 Unspecified dementia without behavioral disturbance: Secondary | ICD-10-CM

## 2013-04-13 DIAGNOSIS — E43 Unspecified severe protein-calorie malnutrition: Secondary | ICD-10-CM | POA: Insufficient documentation

## 2013-04-13 LAB — CBC
HCT: 26.7 % — ABNORMAL LOW (ref 36.0–46.0)
Hemoglobin: 9.1 g/dL — ABNORMAL LOW (ref 12.0–15.0)
MCV: 76.1 fL — ABNORMAL LOW (ref 78.0–100.0)
RBC: 3.51 MIL/uL — ABNORMAL LOW (ref 3.87–5.11)
WBC: 9.5 10*3/uL (ref 4.0–10.5)

## 2013-04-13 LAB — BASIC METABOLIC PANEL
CO2: 19 mEq/L (ref 19–32)
Chloride: 107 mEq/L (ref 96–112)
Glucose, Bld: 101 mg/dL — ABNORMAL HIGH (ref 70–99)
Sodium: 133 mEq/L — ABNORMAL LOW (ref 135–145)

## 2013-04-13 LAB — TYPE AND SCREEN
ABO/RH(D): A NEG
Unit division: 0

## 2013-04-13 LAB — URINE CULTURE: Colony Count: >=100000

## 2013-04-13 MED ORDER — DOCUSATE SODIUM 50 MG/5ML PO LIQD
100.0000 mg | Freq: Two times a day (BID) | ORAL | Status: DC
  Administered 2013-04-13 – 2013-04-17 (×9): 100 mg via ORAL
  Filled 2013-04-13 (×15): qty 10

## 2013-04-13 MED ORDER — SODIUM CHLORIDE 0.9 % IV SOLN
INTRAVENOUS | Status: DC
  Administered 2013-04-13: 75 mL/h via INTRAVENOUS

## 2013-04-13 MED ORDER — BRIMONIDINE TARTRATE 0.15 % OP SOLN: 1.0000 [drp] | Freq: Two times a day (BID) | OPHTHALMIC | Status: DC

## 2013-04-13 MED ORDER — LOSARTAN POTASSIUM 25 MG PO TABS
12.5000 mg | ORAL_TABLET | Freq: Every day | ORAL | Status: DC
  Administered 2013-04-13 – 2013-04-19 (×7): 12.5 mg via ORAL
  Filled 2013-04-13 (×7): qty 0.5

## 2013-04-13 NOTE — Progress Notes (Signed)
Patient has been transferred to 6N bed 28 via bed. Phone report has been called Lindsay,RN. Have notified patient's daughter of the transfer.

## 2013-04-14 LAB — BASIC METABOLIC PANEL
Chloride: 109 mEq/L (ref 96–112)
GFR calc Af Amer: >90 mL/min (ref >90)
GFR calc non Af Amer: 82 mL/min — ABNORMAL LOW (ref >90)
Potassium: 3.2 mEq/L — ABNORMAL LOW (ref 3.5–5.1)

## 2013-04-14 LAB — CBC
MCHC: 32.8 g/dL (ref 30.0–36.0)
Platelets: 165 10*3/uL (ref 150–400)
RDW: 17.1 % — ABNORMAL HIGH (ref 11.5–15.5)

## 2013-04-14 MED ORDER — POTASSIUM CHLORIDE IN NACL 40-0.9 MEQ/L-% IV SOLN
INTRAVENOUS | Status: DC
  Administered 2013-04-14 – 2013-04-16 (×3): via INTRAVENOUS
  Filled 2013-04-14 (×4): qty 1000

## 2013-04-14 MED ORDER — HYDRALAZINE HCL 20 MG/ML IJ SOLN
10.0000 mg | Freq: Four times a day (QID) | INTRAMUSCULAR | Status: DC | PRN
  Administered 2013-04-14: 10 mg via INTRAVENOUS
  Filled 2013-04-14: qty 1

## 2013-04-14 MED ORDER — POLYETHYLENE GLYCOL 3350 17 G PO PACK
17.0000 g | PACK | Freq: Every day | ORAL | Status: DC
  Administered 2013-04-14 – 2013-04-16 (×3): 17 g via ORAL
  Filled 2013-04-14 (×5): qty 1

## 2013-04-14 NOTE — Progress Notes (Signed)
[facility] PROGRESS NOTE  Jean Shaw ZOX:[id number] DOB: 10/07/1928 DOA: 04/12/2013 PCP: Florentina Jenny, MD  Assessment/Plan: Principal Problem:   Sepsis with metabolic encephalopathy Active Problems:   Dementia   Hypertension   Hematuria   Anemia   UTI (lower urinary tract infection)   Protein-calorie malnutrition, severe    Sepsis with metabolic encephalopathy  Hypotension, fever and lactic acidosis meeting criteria for sepsis  Likely secondary to UTI. Daughter reports patient had good appetite until yesterday.  Admitted  to stepdown  IV hydration with NS.  neurochecks  will empirically cover with IV vanco and zosyn given CT findings outlined below. Follow urine cx, ordered blood cx    Lung nodules  seen on CXR concerning for mets. Daughter reports hx of smoking in past. CT chest showed multiple bilateral lung nodules suggestive of mets vs ? Septic emboli. Also showed distal esophageal mass. Discussed with daughter . Does not want any intervention. Given her advanced dementia and poor functional status , she is likely a poor candidate for any treatment.  CT abd and pelvis shows some colonic ileus and   Dementia  Severe. bed bound with blindness and hearing impairment. Recognizes daughter and her grandson but not abel to carry out full conversations. Fully dependent with ADLs    UTI (lower urinary tract infection)  Has hx of chronic UTI and is on nitrofurantoin at home. CT suggests pyelonephritis of left kidney . Also  Monitor I/O  Severe constipation  has rectal impaction with colonic ileus. Will order suppository and enema.   Hypertension  Hold BP meds  Hematuria  Has hx of kidney stones . Has a nonobstructive. Left renal calculus. Monitor H&H. Will plan on further w/up depending upon CT result  Anemia  given 1 u PRBC in ED. Hb improved to 9.5. monitor  DVT prophylaxis: sq lovenox   Code Status: DNR  Family Communication: daugther at bedside. Discussed  goals of care. Does not want intervention for biopsy at this time and understands she is not a good candidate for treatment if lesions are cancerous. Wishes to have her treated for infection , IV fluids , transfusions and monitor.   Discussed with the daughter by the bedside , 2 more days of IV abx      Brief narrative: 79 y/o female with dementia, HTN, renals tones, chronic UTI on abx, brought in by daughter as she appeared febrile this morning and had poor responsiveness associated with some chills. Temp at home was 57F..she also noticed blood in her diapers this morning. Patient was at her baseline until this morning. At baseline she has moderate to severe dementia , is able to carry out conversations mostly at yes or no, fully bedbound and dependent with all her ADLs, awake and alert , has hearing impairment and legally blind.  Daughter called EMS and was brought to the hospital. She had no nausea, vomiting, diarrhea, shortness of breath , c/o pain anywhere, recent fall , poor appetite or worsening mental status until his morning.      Consultants:   Procedures:     Antibiotics: Vanco, imipenem  HPI/Subjective: Somnolent daughter by the bedside , eat breakfast   Objective: Filed Vitals:   04/13/13 1232 04/13/13 1418 04/13/13 2150 04/14/13 0627  BP: 140/50 151/76 171/72 152/64  Pulse:  65 67 65  Temp: 99.4 F (37.4 C) 98 F (36.7 C) 100.6 F (38.1 C) 98.2 F (36.8 C)  TempSrc: Oral Oral Oral Oral  Resp:  16 18 16  Height:  5\' 5"  (1.651 m)    Weight:  44.7 kg (98 lb 8.7 oz)    SpO2: 97% 96% 94% 100%    Intake/Output Summary (Last 24 hours) at 04/14/13 0908 Last data filed at 04/14/13 0454  Gross per 24 hour  Intake      0 ml  Output   1850 ml  Net  -1850 ml    Exam:  Constitutional: Vital signs reviewed.  Thin built elderly female lying in bed poorly communicative  HEENT: no pallor, dry mucosa  Chest: clear b/l, o added sounds  CVS: NS1&S2, no murmurs, rubs  or gallop  ABD: soft, NT, ND, BS+  EXT: warm, no edema  cns: aaox0, poorly communicative.    Data Reviewed: Basic Metabolic Panel:  Recent Labs Lab 04/12/13 0517 04/12/13 0627 04/13/13 0400 04/14/13 0648  NA 133* 135 133* 138  K 4.3 4.4 3.6 3.2*  CL 101 103 107 109  CO2 23  --  19 20  GLUCOSE 108* 106* 101* 90  BUN 14 13 14 12   CREATININE 0.64 0.80 0.63 0.63  CALCIUM 8.6  --  7.8* 7.8*    Liver Function Tests:  Recent Labs Lab 04/12/13 0517  AST 14  ALT 5  ALKPHOS 57  BILITOT 0.3  PROT 6.0  ALBUMIN 2.1*   No results found for this basename: LIPASE, AMYLASE,  in the last 168 hours No results found for this basename: AMMONIA,  in the last 168 hours  CBC:  Recent Labs Lab 04/12/13 0540 04/12/13 0627 04/13/13 0400 04/14/13 0648  WBC 7.8  --  9.5 5.5  HGB 7.1* 9.5* 9.1* 8.5*  HCT 22.5* 28.0* 26.7* 25.9*  MCV 74.3*  --  76.1* 76.9*  PLT 196  --  168 165    Cardiac Enzymes: No results found for this basename: CKTOTAL, CKMB, CKMBINDEX, TROPONINI,  in the last 168 hours BNP (last 3 results) No results found for this basename: PROBNP,  in the last 8760 hours   CBG:  Recent Labs Lab 04/12/13 0525  GLUCAP 119*    Recent Results (from the past 240 hour(s))  URINE CULTURE     Status: None   Collection Time    04/12/13  5:54 AM      Result Value Range Status   Specimen Description URINE, CLEAN CATCH   Final   Special Requests CX ADDED AT 0627 ON [id number]   Final   Culture  Setup Time     Final   Value: 04/12/2013 13:39     Performed at [facility]   Colony Count     Final   Value: >=100,000 COLONIES/ML     Performed at [facility]   Culture     Final   Value: ESCHERICHIA COLI     Performed at [facility]   Report Status 04/13/2013 FINAL   Final   Organism ID, Bacteria ESCHERICHIA COLI   Final  MRSA PCR SCREENING     Status: None   Collection Time    04/12/13  9:03 AM      Result Value Range Status   MRSA by PCR  NEGATIVE  NEGATIVE Final   Comment:            The GeneXpert MRSA Assay (FDA     approved for NASAL specimens     only), is one component of a     comprehensive MRSA colonization     surveillance program. It is not  intended to diagnose MRSA     infection nor to guide or     monitor treatment for     MRSA infections.  CULTURE, BLOOD (ROUTINE X 2)     Status: None   Collection Time    04/12/13  5:09 PM      Result Value Range Status   Specimen Description BLOOD LEFT HAND   Final   Special Requests BOTTLES DRAWN AEROBIC AND ANAEROBIC 10CC   Final   Culture  Setup Time     Final   Value: 04/12/2013 22:53     Performed at [facility]   Culture     Final   Value:        BLOOD CULTURE RECEIVED NO GROWTH TO DATE CULTURE WILL BE HELD FOR 5 DAYS BEFORE ISSUING A FINAL NEGATIVE REPORT     Performed at [facility]   Report Status PENDING   Incomplete  CULTURE, BLOOD (ROUTINE X 2)     Status: None   Collection Time    04/12/13  5:17 PM      Result Value Range Status   Specimen Description BLOOD RIGHT HAND   Final   Special Requests BOTTLES DRAWN AEROBIC AND ANAEROBIC 10CC   Final   Culture  Setup Time     Final   Value: 04/12/2013 22:53     Performed at [facility]   Culture     Final   Value:        BLOOD CULTURE RECEIVED NO GROWTH TO DATE CULTURE WILL BE HELD FOR 5 DAYS BEFORE ISSUING A FINAL NEGATIVE REPORT     Performed at [facility]   Report Status PENDING   Incomplete     Studies: Ct Chest W Contrast  04/12/2013   *RADIOLOGY REPORT*  Clinical Data:  Lung nodules.  Anemia  CT CHEST, ABDOMEN AND PELVIS WITH CONTRAST  Technique:  Multidetector CT imaging of the chest, abdomen and pelvis was performed following the standard protocol during bolus administration of intravenous contrast.  Contrast: 80mL OMNIPAQUE IOHEXOL 300 MG/ML  SOLN  Comparison:  Chest x-ray from today.  CT abdomen pelvis 03/27/2010. CT chest 02/24/2007  CT CHEST  Findings:   Numerous lung nodules are present bilaterally.  Most of the lung nodules have low density central necrosis and many have cavitated with numerous gas filled nodules present.  Several these have air-fluid levels.  The cavitary  nodules have moderately thick irregular walls.  Most of the nodules measure under 2 cm.  Index left lower lobe cavitary nodule measures 19 mm in diameter.  Solid nodule superior segment right lower lobe measures 17 mm.  No significant mediastinal or hilar adenopathy is identified.  Mild cardiac enlargement.  Small pleural effusions bilaterally. Mild atelectasis in the lung bases.  Negative for edema or pneumonia.  No bony destructive process.  No fracture in the thoracic spine.  14 mm mass distal esophagus with associated hiatal hernia.  IMPRESSION: Widespread pulmonary nodules.  Many these are necrotic and many are cavitary.  These could be due to infection such as septic emboli. Metastatic disease also is a consideration.  No evidence of adenopathy.  14 mm mass in the distal esophagus associated with a small hiatal hernia.  This could be a neoplasm of the esophagus versus a benign neoplasm or mucosal thickening related to hiatal hernia.  CT ABDOMEN AND PELVIS  Findings:  The patient is markedly constipated with a large amount of stool throughout dilated  colon.  There is a rectal impaction. No evidence of bowel obstruction.  Colonic ileus is present.  Liver gallbladder and bile ducts are normal.  Pancreas and spleen are normal.  The left kidney shows decreased perfusion compared to the right kidney.  There is no hydronephrosis.  There is a 7 mm nonobstructing midpole stone.  Hypoperfusion of the left kidney may be due to pyelonephritis.  Alternately, there is atherosclerotic calcification in the left renal artery origin which could be a cause for hypoperfusion of the left kidney is normal in size and infection is favored.  The right kidney shows no obstruction or mass or stone.  Heavily  calcified aorta and iliac arteries without aortic aneurysm. No adenopathy or free fluid.  No lumbar spine fracture or bony destructive lesion.  IMPRESSION: Mass lesion distal esophagus see above CT chest report  Severe constipation and rectal impaction with colonic ileus.  Hypoperfusion of the left kidney.  This may be due to pyelonephritis.  There is a nonobstructing left renal calculus.   Original Report Authenticated By: Janeece Riggers, M.D.   Ct Abdomen Pelvis W Contrast  04/12/2013   *RADIOLOGY REPORT*  Clinical Data:  Lung nodules.  Anemia  CT CHEST, ABDOMEN AND PELVIS WITH CONTRAST  Technique:  Multidetector CT imaging of the chest, abdomen and pelvis was performed following the standard protocol during bolus administration of intravenous contrast.  Contrast: 80mL OMNIPAQUE IOHEXOL 300 MG/ML  SOLN  Comparison:  Chest x-ray from today.  CT abdomen pelvis 03/27/2010. CT chest 02/24/2007  CT CHEST  Findings:  Numerous lung nodules are present bilaterally.  Most of the lung nodules have low density central necrosis and many have cavitated with numerous gas filled nodules present.  Several these have air-fluid levels.  The cavitary  nodules have moderately thick irregular walls.  Most of the nodules measure under 2 cm.  Index left lower lobe cavitary nodule measures 19 mm in diameter.  Solid nodule superior segment right lower lobe measures 17 mm.  No significant mediastinal or hilar adenopathy is identified.  Mild cardiac enlargement.  Small pleural effusions bilaterally. Mild atelectasis in the lung bases.  Negative for edema or pneumonia.  No bony destructive process.  No fracture in the thoracic spine.  14 mm mass distal esophagus with associated hiatal hernia.  IMPRESSION: Widespread pulmonary nodules.  Many these are necrotic and many are cavitary.  These could be due to infection such as septic emboli. Metastatic disease also is a consideration.  No evidence of adenopathy.  14 mm mass in the distal esophagus  associated with a small hiatal hernia.  This could be a neoplasm of the esophagus versus a benign neoplasm or mucosal thickening related to hiatal hernia.  CT ABDOMEN AND PELVIS  Findings:  The patient is markedly constipated with a large amount of stool throughout dilated colon.  There is a rectal impaction. No evidence of bowel obstruction.  Colonic ileus is present.  Liver gallbladder and bile ducts are normal.  Pancreas and spleen are normal.  The left kidney shows decreased perfusion compared to the right kidney.  There is no hydronephrosis.  There is a 7 mm nonobstructing midpole stone.  Hypoperfusion of the left kidney may be due to pyelonephritis.  Alternately, there is atherosclerotic calcification in the left renal artery origin which could be a cause for hypoperfusion of the left kidney is normal in size and infection is favored.  The right kidney shows no obstruction or mass or stone.  Heavily calcified  aorta and iliac arteries without aortic aneurysm. No adenopathy or free fluid.  No lumbar spine fracture or bony destructive lesion.  IMPRESSION: Mass lesion distal esophagus see above CT chest report  Severe constipation and rectal impaction with colonic ileus.  Hypoperfusion of the left kidney.  This may be due to pyelonephritis.  There is a nonobstructing left renal calculus.   Original Report Authenticated By: Janeece Riggers, M.D.   Dg Chest Port 1 View  04/12/2013   *RADIOLOGY REPORT*  Clinical Data: Altered mental status.  PORTABLE CHEST - 1 VIEW  Comparison: CT chest 02/24/2007 and single view of the chest 03/27/2010.  Findings: Multiple bilateral nodular opacities are identified.  One of the largest is in the left upper lobe and measures 1.4 cm in diameter.  No consolidative process, pneumothorax or effusion is seen.  No focal bony abnormality is identified.  IMPRESSION: Bilateral pulmonary nodules worrisome for metastatic disease.   Original Report Authenticated By: Holley Dexter, M.D.     Scheduled Meds: . antiseptic oral rinse  15 mL Mouth Rinse BID  . brimonidine  1 drop Both Eyes BID  . docusate  100 mg Oral BID  . donepezil  10 mg Oral QHS  . enoxaparin (LOVENOX) injection  30 mg Subcutaneous Q24H  . feeding supplement  237 mL Oral BID BM  . losartan  12.5 mg Oral Daily  . piperacillin-tazobactam  3.375 g Intravenous Q8H  . sodium chloride  3 mL Intravenous Q12H  . vancomycin  750 mg Intravenous Q24H   Continuous Infusions:   Principal Problem:   Sepsis with metabolic encephalopathy Active Problems:   Dementia   Hypertension   Hematuria   Anemia   UTI (lower urinary tract infection)   Protein-calorie malnutrition, severe    Time spent: 40 minutes   [facility]  [facility] Pager [phone]. If 8PM-8AM, please contact night-coverage at [email or web address], password [facility] 04/14/2013, 9:08 AM  LOS: 2 days

## 2013-04-15 NOTE — Progress Notes (Signed)
ANTIBIOTIC CONSULT NOTE - Follow-Up  Pharmacy Consult for Vancomycin Indication: Pyelonephritis  No Known Allergies  Patient Measurements: Height: 5\' 5"  (165.1 cm) Weight: 98 lb 8.7 oz (44.7 kg) IBW/kg (Calculated) : 57   Vital Signs: Temp: 99.1 F (37.3 C) (08/07 0620) BP: 143/57 mmHg (08/07 0620) Pulse Rate: 64 (08/07 0620) Intake/Output from previous day: 08/06 0701 - 08/07 0700 In: 1065 [P.O.:480; I.V.:585] Out: 2250 [Urine:2250] Intake/Output from this shift: Total I/O In: 360 [P.O.:360] Out: 550 [Urine:550]  Labs:  Recent Labs  04/13/13 0400 04/14/13 0648  WBC 9.5 5.5  HGB 9.1* 8.5*  PLT 168 165  CREATININE 0.63 0.63   Estimated Creatinine Clearance: 38.9 ml/min (by C-G formula based on Cr of 0.63). No results found for this basename: Rolm Gala, VANCORANDOM, GENTTROUGH, GENTPEAK, GENTRANDOM, TOBRATROUGH, TOBRAPEAK, TOBRARND, AMIKACINPEAK, AMIKACINTROU, AMIKACIN,  in the last 72 hours   Microbiology: Recent Results (from the past 720 hour(s))  URINE CULTURE     Status: None   Collection Time    04/12/13  5:54 AM      Result Value Range Status   Specimen Description URINE, CLEAN CATCH   Final   Special Requests CX ADDED AT 0627 ON [id number]   Final   Culture  Setup Time     Final   Value: 04/12/2013 13:39     Performed at [facility]   Colony Count     Final   Value: >=100,000 COLONIES/ML     Performed at [facility]   Culture     Final   Value: ESCHERICHIA COLI     Performed at [facility]   Report Status 04/13/2013 FINAL   Final   Organism ID, Bacteria ESCHERICHIA COLI   Final  MRSA PCR SCREENING     Status: None   Collection Time    04/12/13  9:03 AM      Result Value Range Status   MRSA by PCR NEGATIVE  NEGATIVE Final   Comment:            The GeneXpert MRSA Assay (FDA     approved for NASAL specimens     only), is one component of a     comprehensive MRSA colonization     surveillance program. It is not      intended to diagnose MRSA     infection nor to guide or     monitor treatment for     MRSA infections.  CULTURE, BLOOD (ROUTINE X 2)     Status: None   Collection Time    04/12/13  5:09 PM      Result Value Range Status   Specimen Description BLOOD LEFT HAND   Final   Special Requests BOTTLES DRAWN AEROBIC AND ANAEROBIC 10CC   Final   Culture  Setup Time     Final   Value: 04/12/2013 22:53     Performed at [facility]   Culture     Final   Value:        BLOOD CULTURE RECEIVED NO GROWTH TO DATE CULTURE WILL BE HELD FOR 5 DAYS BEFORE ISSUING A FINAL NEGATIVE REPORT     Performed at [facility]   Report Status PENDING   Incomplete  CULTURE, BLOOD (ROUTINE X 2)     Status: None   Collection Time    04/12/13  5:17 PM      Result Value Range Status   Specimen Description BLOOD RIGHT HAND   Final  Special Requests BOTTLES DRAWN AEROBIC AND ANAEROBIC 10CC   Final   Culture  Setup Time     Final   Value: 04/12/2013 22:53     Performed at [facility]   Culture     Final   Value:        BLOOD CULTURE RECEIVED NO GROWTH TO DATE CULTURE WILL BE HELD FOR 5 DAYS BEFORE ISSUING A FINAL NEGATIVE REPORT     Performed at [facility]   Report Status PENDING   Incomplete   Assessment: 83yof on Vancomycin and Zosyn (Day #4) for pyelonephritis. Tmax 101.2, wbc wnl, UA dirty; chronic UTI - on nitrofurantoin PTA  Vanc 8/4>> Zosyn 8/4>> CTX x 1 dose (8/4)  8/4: BCx>>NGTD 8/4: Urine>> >100,000 EColi R amp, nitro 8/4: MRSA PCR neg   Goal of Therapy:  Vancomycin trough level 15-20 mcg/ml  Plan:  1. Vancomycin 750mg  IV q24h 2. Continue Zosyn 3.375g IV q8h - infuse over 4 hours 3. Monitor renal function, cultures, clinical course  4. Will check trough in next 1-2 days if vancomycin continues 5. Consider narrow antibiotics to Rocephin or Ancef  Christoper Fabian, PharmD, BCPS Clinical pharmacist, pager [phone] 04/15/2013,1:25 PM

## 2013-04-15 NOTE — Progress Notes (Signed)
[facility] PROGRESS NOTE  Jean Shaw XBM:[id number] DOB: [birth year] DOA: 04/12/2013 PCP: Florentina Jenny, MD  Assessment/Plan: Principal Problem:   Sepsis with metabolic encephalopathy Active Problems:   Dementia   Hypertension   Hematuria   Anemia   UTI (lower urinary tract infection)   Protein-calorie malnutrition, severe    Sepsis with metabolic encephalopathy  Hypotension, fever and lactic acidosis meeting criteria for sepsis  Likely secondary to UTI. Daughter reports patient had good appetite until yesterday.  IV hydration with NS.  neurochecks  Continue  IV vanco and zosyn given CT findings outlined below. Follow urine cx shows E COLI , ordered blood cx   Lung nodules  seen on CXR concerning for mets. Daughter reports hx of smoking in past. CT chest showed multiple bilateral lung nodules suggestive of mets vs ? Septic emboli. Also showed distal esophageal mass. Discussed with daughter . Does not want any intervention. Given her advanced dementia and poor functional status , she is likely a poor candidate for any treatment.  CT abd and pelvis shows some colonic ileus    Dementia  Severe. bed bound with blindness and hearing impairment. Recognizes daughter and her grandson but not abel to carry out full conversations. Fully dependent with ADLs   UTI (lower urinary tract infection)  Has hx of chronic UTI and is on nitrofurantoin at home. CT suggests pyelonephritis of left kidney . Also  Monitor I/O  Severe constipation  has rectal impaction with colonic ileus. 2-3 BM yesterday   Hypertension  Hold BP meds  Hematuria  Has hx of kidney stones . Has a nonobstructive. Left renal calculus. Monitor H&H. Will plan on further w/up depending upon CT result  Anemia  given 1 u PRBC in ED. Hb improved to 9.5. monitor  DVT prophylaxis: sq lovenox   Code Status: DNR  Family Communication: daugther at bedside. Discussed goals of care. Does not want intervention for  biopsy at this time and understands she is not a good candidate for treatment if lesions are cancerous. Wishes to have her treated for infection , IV fluids , transfusions and monitor.  Discussed with the daughter by the bedside , 2 more days of IV abx    Brief narrative:  79 y/o female with dementia, HTN, renals tones, chronic UTI on abx, brought in by daughter as she appeared febrile this morning and had poor responsiveness associated with some chills. Temp at home was 71F..she also noticed blood in her diapers this morning. Patient was at her baseline until this morning. At baseline she has moderate to severe dementia , is able to carry out conversations mostly at yes or no, fully bedbound and dependent with all her ADLs, awake and alert , has hearing impairment and legally blind.  Daughter called EMS and was brought to the hospital. She had no nausea, vomiting, diarrhea, shortness of breath , c/o pain anywhere, recent fall , poor appetite or worsening mental status until his morning.    Consultants:  Procedures:  Antibiotics:  Vanco, zosyn    HPI/Subjective:  Stable overnight   Objective: Filed Vitals:   04/14/13 1411 04/14/13 2211 04/15/13 0210 04/15/13 0620  BP: 161/72 143/55 166/62 143/57  Pulse: 61 70 71 64  Temp: 97.5 F (36.4 C) 101.2 F (38.4 C) 100.9 F (38.3 C) 99.1 F (37.3 C)  TempSrc:  Oral    Resp: 16 17 16 15   Height:      Weight:      SpO2: 100% 97% 100% 100%  Intake/Output Summary (Last 24 hours) at 04/15/13 1214 Last data filed at 04/15/13 1048  Gross per 24 hour  Intake   1185 ml  Output   2250 ml  Net  -1065 ml    Exam:  HENT:  Head: Atraumatic.  Nose: Nose normal.  Mouth/Throat: Oropharynx is clear and moist.  Eyes: Conjunctivae are normal. Pupils are equal, round, and reactive to light. No scleral icterus.  Neck: Neck supple. No tracheal deviation present.  Cardiovascular: Normal rate, regular rhythm, normal heart sounds and intact distal  pulses.  Pulmonary/Chest: Effort normal and breath sounds normal. No respiratory distress.  Abdominal: Soft. Normal appearance and bowel sounds are normal. She exhibits no distension. There is no tenderness.  Musculoskeletal: She exhibits no edema and no tenderness.  Neurological: She is alert. No cranial nerve deficit.    Data Reviewed: Basic Metabolic Panel:  Recent Labs Lab 04/12/13 0517 04/12/13 0627 04/13/13 0400 04/14/13 0648  NA 133* 135 133* 138  K 4.3 4.4 3.6 3.2*  CL 101 103 107 109  CO2 23  --  19 20  GLUCOSE 108* 106* 101* 90  BUN 14 13 14 12   CREATININE 0.64 0.80 0.63 0.63  CALCIUM 8.6  --  7.8* 7.8*    Liver Function Tests:  Recent Labs Lab 04/12/13 0517  AST 14  ALT 5  ALKPHOS 57  BILITOT 0.3  PROT 6.0  ALBUMIN 2.1*   No results found for this basename: LIPASE, AMYLASE,  in the last 168 hours No results found for this basename: AMMONIA,  in the last 168 hours  CBC:  Recent Labs Lab 04/12/13 0540 04/12/13 0627 04/13/13 0400 04/14/13 0648  WBC 7.8  --  9.5 5.5  HGB 7.1* 9.5* 9.1* 8.5*  HCT 22.5* 28.0* 26.7* 25.9*  MCV 74.3*  --  76.1* 76.9*  PLT 196  --  168 165    Cardiac Enzymes: No results found for this basename: CKTOTAL, CKMB, CKMBINDEX, TROPONINI,  in the last 168 hours BNP (last 3 results) No results found for this basename: PROBNP,  in the last 8760 hours   CBG:  Recent Labs Lab 04/12/13 0525  GLUCAP 119*    Recent Results (from the past 240 hour(s))  URINE CULTURE     Status: None   Collection Time    04/12/13  5:54 AM      Result Value Range Status   Specimen Description URINE, CLEAN CATCH   Final   Special Requests CX ADDED AT 0627 ON [id number]   Final   Culture  Setup Time     Final   Value: 04/12/2013 13:39     Performed at [facility]   Colony Count     Final   Value: >=100,000 COLONIES/ML     Performed at [facility]   Culture     Final   Value: ESCHERICHIA COLI     Performed at [facility]   Report Status 04/13/2013 FINAL   Final   Organism ID, Bacteria ESCHERICHIA COLI   Final  MRSA PCR SCREENING     Status: None   Collection Time    04/12/13  9:03 AM      Result Value Range Status   MRSA by PCR NEGATIVE  NEGATIVE Final   Comment:            The GeneXpert MRSA Assay (FDA     approved for NASAL specimens     only), is one component of a  comprehensive MRSA colonization     surveillance program. It is not     intended to diagnose MRSA     infection nor to guide or     monitor treatment for     MRSA infections.  CULTURE, BLOOD (ROUTINE X 2)     Status: None   Collection Time    04/12/13  5:09 PM      Result Value Range Status   Specimen Description BLOOD LEFT HAND   Final   Special Requests BOTTLES DRAWN AEROBIC AND ANAEROBIC 10CC   Final   Culture  Setup Time     Final   Value: 04/12/2013 22:53     Performed at [facility]   Culture     Final   Value:        BLOOD CULTURE RECEIVED NO GROWTH TO DATE CULTURE WILL BE HELD FOR 5 DAYS BEFORE ISSUING A FINAL NEGATIVE REPORT     Performed at [facility]   Report Status PENDING   Incomplete  CULTURE, BLOOD (ROUTINE X 2)     Status: None   Collection Time    04/12/13  5:17 PM      Result Value Range Status   Specimen Description BLOOD RIGHT HAND   Final   Special Requests BOTTLES DRAWN AEROBIC AND ANAEROBIC 10CC   Final   Culture  Setup Time     Final   Value: 04/12/2013 22:53     Performed at [facility]   Culture     Final   Value:        BLOOD CULTURE RECEIVED NO GROWTH TO DATE CULTURE WILL BE HELD FOR 5 DAYS BEFORE ISSUING A FINAL NEGATIVE REPORT     Performed at [facility]   Report Status PENDING   Incomplete     Studies: Ct Chest W Contrast  04/12/2013   *RADIOLOGY REPORT*  Clinical Data:  Lung nodules.  Anemia  CT CHEST, ABDOMEN AND PELVIS WITH CONTRAST  Technique:  Multidetector CT imaging of the chest, abdomen and pelvis was performed following the standard  protocol during bolus administration of intravenous contrast.  Contrast: 80mL OMNIPAQUE IOHEXOL 300 MG/ML  SOLN  Comparison:  Chest x-ray from today.  CT abdomen pelvis 03/27/2010. CT chest 02/24/2007  CT CHEST  Findings:  Numerous lung nodules are present bilaterally.  Most of the lung nodules have low density central necrosis and many have cavitated with numerous gas filled nodules present.  Several these have air-fluid levels.  The cavitary  nodules have moderately thick irregular walls.  Most of the nodules measure under 2 cm.  Index left lower lobe cavitary nodule measures 19 mm in diameter.  Solid nodule superior segment right lower lobe measures 17 mm.  No significant mediastinal or hilar adenopathy is identified.  Mild cardiac enlargement.  Small pleural effusions bilaterally. Mild atelectasis in the lung bases.  Negative for edema or pneumonia.  No bony destructive process.  No fracture in the thoracic spine.  14 mm mass distal esophagus with associated hiatal hernia.  IMPRESSION: Widespread pulmonary nodules.  Many these are necrotic and many are cavitary.  These could be due to infection such as septic emboli. Metastatic disease also is a consideration.  No evidence of adenopathy.  14 mm mass in the distal esophagus associated with a small hiatal hernia.  This could be a neoplasm of the esophagus versus a benign neoplasm or mucosal thickening related to hiatal hernia.  CT ABDOMEN AND PELVIS  Findings:  The patient is markedly constipated with a large amount of stool throughout dilated colon.  There is a rectal impaction. No evidence of bowel obstruction.  Colonic ileus is present.  Liver gallbladder and bile ducts are normal.  Pancreas and spleen are normal.  The left kidney shows decreased perfusion compared to the right kidney.  There is no hydronephrosis.  There is a 7 mm nonobstructing midpole stone.  Hypoperfusion of the left kidney may be due to pyelonephritis.  Alternately, there is atherosclerotic  calcification in the left renal artery origin which could be a cause for hypoperfusion of the left kidney is normal in size and infection is favored.  The right kidney shows no obstruction or mass or stone.  Heavily calcified aorta and iliac arteries without aortic aneurysm. No adenopathy or free fluid.  No lumbar spine fracture or bony destructive lesion.  IMPRESSION: Mass lesion distal esophagus see above CT chest report  Severe constipation and rectal impaction with colonic ileus.  Hypoperfusion of the left kidney.  This may be due to pyelonephritis.  There is a nonobstructing left renal calculus.   Original Report Authenticated By: Janeece Riggers, M.D.   Ct Abdomen Pelvis W Contrast  04/12/2013   *RADIOLOGY REPORT*  Clinical Data:  Lung nodules.  Anemia  CT CHEST, ABDOMEN AND PELVIS WITH CONTRAST  Technique:  Multidetector CT imaging of the chest, abdomen and pelvis was performed following the standard protocol during bolus administration of intravenous contrast.  Contrast: 80mL OMNIPAQUE IOHEXOL 300 MG/ML  SOLN  Comparison:  Chest x-ray from today.  CT abdomen pelvis 03/27/2010. CT chest 02/24/2007  CT CHEST  Findings:  Numerous lung nodules are present bilaterally.  Most of the lung nodules have low density central necrosis and many have cavitated with numerous gas filled nodules present.  Several these have air-fluid levels.  The cavitary  nodules have moderately thick irregular walls.  Most of the nodules measure under 2 cm.  Index left lower lobe cavitary nodule measures 19 mm in diameter.  Solid nodule superior segment right lower lobe measures 17 mm.  No significant mediastinal or hilar adenopathy is identified.  Mild cardiac enlargement.  Small pleural effusions bilaterally. Mild atelectasis in the lung bases.  Negative for edema or pneumonia.  No bony destructive process.  No fracture in the thoracic spine.  14 mm mass distal esophagus with associated hiatal hernia.  IMPRESSION: Widespread pulmonary  nodules.  Many these are necrotic and many are cavitary.  These could be due to infection such as septic emboli. Metastatic disease also is a consideration.  No evidence of adenopathy.  14 mm mass in the distal esophagus associated with a small hiatal hernia.  This could be a neoplasm of the esophagus versus a benign neoplasm or mucosal thickening related to hiatal hernia.  CT ABDOMEN AND PELVIS  Findings:  The patient is markedly constipated with a large amount of stool throughout dilated colon.  There is a rectal impaction. No evidence of bowel obstruction.  Colonic ileus is present.  Liver gallbladder and bile ducts are normal.  Pancreas and spleen are normal.  The left kidney shows decreased perfusion compared to the right kidney.  There is no hydronephrosis.  There is a 7 mm nonobstructing midpole stone.  Hypoperfusion of the left kidney may be due to pyelonephritis.  Alternately, there is atherosclerotic calcification in the left renal artery origin which could be a cause for hypoperfusion of the left kidney is normal in size and infection is  favored.  The right kidney shows no obstruction or mass or stone.  Heavily calcified aorta and iliac arteries without aortic aneurysm. No adenopathy or free fluid.  No lumbar spine fracture or bony destructive lesion.  IMPRESSION: Mass lesion distal esophagus see above CT chest report  Severe constipation and rectal impaction with colonic ileus.  Hypoperfusion of the left kidney.  This may be due to pyelonephritis.  There is a nonobstructing left renal calculus.   Original Report Authenticated By: Janeece Riggers, M.D.   Dg Chest Port 1 View  04/12/2013   *RADIOLOGY REPORT*  Clinical Data: Altered mental status.  PORTABLE CHEST - 1 VIEW  Comparison: CT chest 02/24/2007 and single view of the chest 03/27/2010.  Findings: Multiple bilateral nodular opacities are identified.  One of the largest is in the left upper lobe and measures 1.4 cm in diameter.  No consolidative  process, pneumothorax or effusion is seen.  No focal bony abnormality is identified.  IMPRESSION: Bilateral pulmonary nodules worrisome for metastatic disease.   Original Report Authenticated By: Holley Dexter, M.D.    Scheduled Meds: . antiseptic oral rinse  15 mL Mouth Rinse BID  . brimonidine  1 drop Both Eyes BID  . docusate  100 mg Oral BID  . donepezil  10 mg Oral QHS  . enoxaparin (LOVENOX) injection  30 mg Subcutaneous Q24H  . feeding supplement  237 mL Oral BID BM  . losartan  12.5 mg Oral Daily  . piperacillin-tazobactam  3.375 g Intravenous Q8H  . polyethylene glycol  17 g Oral Daily  . sodium chloride  3 mL Intravenous Q12H  . vancomycin  750 mg Intravenous Q24H   Continuous Infusions: . 0.9 % NaCl with KCl 40 mEq / L 50 mL/hr at 04/15/13 4098    Principal Problem:   Sepsis with metabolic encephalopathy Active Problems:   Dementia   Hypertension   Hematuria   Anemia   UTI (lower urinary tract infection)   Protein-calorie malnutrition, severe    Time spent: 40 minutes   [facility]  [facility] Pager [phone]. If 8PM-8AM, please contact night-coverage at [email or web address], password [facility] 04/15/2013, 12:14 PM  LOS: 3 days

## 2013-04-15 NOTE — Progress Notes (Addendum)
[facility] Progress Note [place] TEAM 1 - Stepdown/ICU TEAM   Jean Shaw WUJ:[id number] DOB: 1929/07/06 DOA: 04/12/2013 PCP: Florentina Jenny, MD  Brief narrative: 79 y/o female with dementia, HTN, renals tones, chronic UTI on abx, brought in by daughter as she appeared febrile this morning and had poor responsiveness associated with some chills. Temp at home was 33F..she also noticed blood in her diapers this morning. Patient was at her baseline until this morning. At baseline she has moderate to severe dementia , is able to carry out conversations mostly at yes or no, fully bedbound and dependent with all her ADLs, awake and alert , has hearing impairment and legally blind.  Daughter called EMS and was brought to the hospital.    Assessment/Plan: Principal Problem:   Sepsis with metabolic encephalopathy - likely due to UTI/ Pyelo- f/u cultures - on Nitrofurantoin at home - obviously not working therefore should d/c  Active Problems:   Dementia - stable    Hypertension - cotn losartan    Hematuria - due to UTI? Follow    Anemia - given 1 U PRBC in ER - repeat Hgb stable    UTI (lower urinary tract infection) - as above    Protein-calorie malnutrition, severe - f/u PO intake  Severe dilated Colon - no problems with constipation - nurse did rectal when placing suppository and noted no obstruction- only soft stool   Lung nodules-  Possible cancerous- daugther does not want work up    Code Status: DNR Family Communication: daughter Disposition Plan: transfer to med surg  Consultants: none  Procedures: none  Antibiotics: Zosyn  DVT prophylaxis: lovenox  HPI/Subjective: Pt non-verbal- daughter at bedside only wants her current infection to be treated- notes that urine gets bloody when she gets a UTI   Objective: Blood pressure 143/57, pulse 64, temperature 99.1 F (37.3 C), temperature source Oral, resp. rate 15, height 5\' 5"  (1.651 m),  weight 44.7 kg (98 lb 8.7 oz), SpO2 100.00%.  Intake/Output Summary (Last 24 hours) at 04/15/13 0804 Last data filed at 04/15/13 0620  Gross per 24 hour  Intake   1065 ml  Output   2250 ml  Net  -1185 ml     Exam: General: -No acute respiratory distress Lungs: Clear to auscultation bilaterally without wheezes or crackles Cardiovascular: Regular rate and rhythm without murmur gallop or rub normal S1 and S2 Abdomen: Nontender, nondistended, soft, bowel sounds positive, no rebound, no ascites, no appreciable mass Extremities: No significant cyanosis, clubbing, or edema bilateral lower extremities  Data Reviewed: Basic Metabolic Panel:  Recent Labs Lab 04/12/13 0517 04/12/13 0627 04/13/13 0400 04/14/13 0648  NA 133* 135 133* 138  K 4.3 4.4 3.6 3.2*  CL 101 103 107 109  CO2 23  --  19 20  GLUCOSE 108* 106* 101* 90  BUN 14 13 14 12   CREATININE 0.64 0.80 0.63 0.63  CALCIUM 8.6  --  7.8* 7.8*   Liver Function Tests:  Recent Labs Lab 04/12/13 0517  AST 14  ALT 5  ALKPHOS 57  BILITOT 0.3  PROT 6.0  ALBUMIN 2.1*   No results found for this basename: LIPASE, AMYLASE,  in the last 168 hours No results found for this basename: AMMONIA,  in the last 168 hours CBC:  Recent Labs Lab 04/12/13 0540 04/12/13 0627 04/13/13 0400 04/14/13 0648  WBC 7.8  --  9.5 5.5  HGB 7.1* 9.5* 9.1* 8.5*  HCT 22.5* 28.0* 26.7* 25.9*  MCV 74.3*  --  76.1* 76.9*  PLT 196  --  168 165   Cardiac Enzymes: No results found for this basename: CKTOTAL, CKMB, CKMBINDEX, TROPONINI,  in the last 168 hours BNP (last 3 results) No results found for this basename: PROBNP,  in the last 8760 hours CBG:  Recent Labs Lab 04/12/13 0525  GLUCAP 119*    Recent Results (from the past 240 hour(s))  URINE CULTURE     Status: None   Collection Time    04/12/13  5:54 AM      Result Value Range Status   Specimen Description URINE, CLEAN CATCH   Final   Special Requests CX ADDED AT 0627 ON [id number]    Final   Culture  Setup Time     Final   Value: 04/12/2013 13:39     Performed at [facility]   Colony Count     Final   Value: >=100,000 COLONIES/ML     Performed at [facility]   Culture     Final   Value: ESCHERICHIA COLI     Performed at [facility]   Report Status 04/13/2013 FINAL   Final   Organism ID, Bacteria ESCHERICHIA COLI   Final  MRSA PCR SCREENING     Status: None   Collection Time    04/12/13  9:03 AM      Result Value Range Status   MRSA by PCR NEGATIVE  NEGATIVE Final   Comment:            The GeneXpert MRSA Assay (FDA     approved for NASAL specimens     only), is one component of a     comprehensive MRSA colonization     surveillance program. It is not     intended to diagnose MRSA     infection nor to guide or     monitor treatment for     MRSA infections.  CULTURE, BLOOD (ROUTINE X 2)     Status: None   Collection Time    04/12/13  5:09 PM      Result Value Range Status   Specimen Description BLOOD LEFT HAND   Final   Special Requests BOTTLES DRAWN AEROBIC AND ANAEROBIC 10CC   Final   Culture  Setup Time     Final   Value: 04/12/2013 22:53     Performed at [facility]   Culture     Final   Value:        BLOOD CULTURE RECEIVED NO GROWTH TO DATE CULTURE WILL BE HELD FOR 5 DAYS BEFORE ISSUING A FINAL NEGATIVE REPORT     Performed at [facility]   Report Status PENDING   Incomplete  CULTURE, BLOOD (ROUTINE X 2)     Status: None   Collection Time    04/12/13  5:17 PM      Result Value Range Status   Specimen Description BLOOD RIGHT HAND   Final   Special Requests BOTTLES DRAWN AEROBIC AND ANAEROBIC 10CC   Final   Culture  Setup Time     Final   Value: 04/12/2013 22:53     Performed at [facility]   Culture     Final   Value:        BLOOD CULTURE RECEIVED NO GROWTH TO DATE CULTURE WILL BE HELD FOR 5 DAYS BEFORE ISSUING A FINAL NEGATIVE REPORT     Performed at [facility]   Report Status  PENDING  Incomplete     Studies:  Recent x-ray studies have been reviewed in detail by the Attending Physician  Scheduled Meds:  Scheduled Meds: . antiseptic oral rinse  15 mL Mouth Rinse BID  . brimonidine  1 drop Both Eyes BID  . docusate  100 mg Oral BID  . donepezil  10 mg Oral QHS  . enoxaparin (LOVENOX) injection  30 mg Subcutaneous Q24H  . feeding supplement  237 mL Oral BID BM  . losartan  12.5 mg Oral Daily  . piperacillin-tazobactam  3.375 g Intravenous Q8H  . polyethylene glycol  17 g Oral Daily  . sodium chloride  3 mL Intravenous Q12H  . vancomycin  750 mg Intravenous Q24H   Continuous Infusions: . 0.9 % NaCl with KCl 40 mEq / L 50 mL/hr at 04/15/13 0556    Time spent on care of this patient: 35 min   Verneta Hamidi, MD  [facility] Office  [phone] Pager - Text Page per Loretha Stapler as per below:  On-Call/Text Page:      [email or web address]      password [email or web address]  If 7PM-7AM, please contact night-coverage [email or web address] Password [email or web address] 04/13/2013, 3:04 PM

## 2013-04-16 ENCOUNTER — Inpatient Hospital Stay (HOSPITAL_COMMUNITY): Payer: Medicare Other

## 2013-04-16 LAB — COMPREHENSIVE METABOLIC PANEL
Albumin: 1.5 g/dL — ABNORMAL LOW (ref 3.5–5.2)
BUN: 11 mg/dL (ref 6–23)
CO2: 21 mEq/L (ref 19–32)
Calcium: 8.3 mg/dL — ABNORMAL LOW (ref 8.4–10.5)
Chloride: 109 mEq/L (ref 96–112)
Creatinine, Ser: 0.58 mg/dL (ref 0.50–1.10)
GFR calc non Af Amer: 84 mL/min — ABNORMAL LOW (ref >90)
Total Bilirubin: 0.3 mg/dL (ref 0.3–1.2)

## 2013-04-16 MED ORDER — FUROSEMIDE 10 MG/ML IJ SOLN
20.0000 mg | Freq: Once | INTRAMUSCULAR | Status: AC
  Administered 2013-04-16: 20 mg via INTRAVENOUS
  Filled 2013-04-16: qty 2

## 2013-04-16 MED ORDER — POTASSIUM CHLORIDE 10 MEQ/100ML IV SOLN
10.0000 meq | INTRAVENOUS | Status: AC
  Administered 2013-04-16 (×4): 10 meq via INTRAVENOUS
  Filled 2013-04-16: qty 200
  Filled 2013-04-16: qty 100

## 2013-04-16 MED ORDER — BISACODYL 10 MG RE SUPP
10.0000 mg | Freq: Once | RECTAL | Status: AC
  Administered 2013-04-16: 10 mg via RECTAL
  Filled 2013-04-16: qty 1

## 2013-04-16 NOTE — Progress Notes (Signed)
[facility] PROGRESS NOTE  Meghana Tullo WUJ:[id number] DOB: [birth year] DOA: 04/12/2013 PCP: Florentina Jenny, MD  Assessment/Plan: Principal Problem:   Sepsis with metabolic encephalopathy Active Problems:   Dementia   Hypertension   Hematuria   Anemia   UTI (lower urinary tract infection)   Protein-calorie malnutrition, severe    Sepsis with metabolic encephalopathy  Hypotension, fever and lactic acidosis meeting criteria for sepsis  Likely secondary to UTI.  Continues to have intermittent fever, suspect aspiration IV hydration with NS.  neurochecks unchanged Continue IV vanco and zosyn given CT findings outlined below. Follow urine cx shows E COLI , follow blood culture Speech therapy consultation   Lung nodules  seen on CXR concerning for mets. Daughter reports hx of smoking in past. CT chest showed multiple bilateral lung nodules suggestive of mets vs ? Septic emboli. Also showed distal esophageal mass. Discussed with daughter . Does not want any intervention. Given her advanced dementia and poor functional status , she is likely a poor candidate for any treatment.  CT abd and pelvis shows some colonic ileus , resolved Repeat chest x-ray worse, will DC IVF , give one dose of IV lasix  If no improvement then daughter is willing to consider palliative care   Dementia  Severe. bed bound with blindness and hearing impairment. Recognizes daughter and her grandson but not abel to carry out full conversations. Fully dependent with ADLs    UTI (lower urinary tract infection)  Has hx of chronic UTI and is on nitrofurantoin at home. CT suggests pyelonephritis of left kidney . Also  Monitor I/O  Severe constipation  has rectal impaction with colonic ileus, resolved. 2-3 BM yesterday   Hypertension  Hold BP meds  Hematuria  Has hx of kidney stones . Has a nonobstructive. Left renal calculus. Monitor H&H. Will plan on further w/up depending upon CT result  Anemia   given 1 u PRBC in ED. Hb improved to 9.5. monitor  DVT prophylaxis: sq lovenox    Code Status: DNR  Family Communication: daugther at bedside. Discussed goals of care. Does not want intervention for biopsy at this time and understands she is not a good candidate for treatment if lesions are cancerous. Wishes to have her treated for infection , IV fluids , transfusions and monitor.  Palliative care consultation if no improvement by tomorrow   Brief narrative:  79 y/o female with dementia, HTN, renals tones, chronic UTI on abx, brought in by daughter as she appeared febrile this morning and had poor responsiveness associated with some chills. Temp at home was 41F..she also noticed blood in her diapers this morning. Patient was at her baseline until this morning. At baseline she has moderate to severe dementia , is able to carry out conversations mostly at yes or no, fully bedbound and dependent with all her ADLs, awake and alert , has hearing impairment and legally blind.  Daughter called EMS and was brought to the hospital. She had no nausea, vomiting, diarrhea, shortness of breath , c/o pain anywhere, recent fall , poor appetite or worsening mental status until his morning.    Consultants:  Procedures:  Antibiotics:  Vanco, zosyn  HPI/Subjective:  Breathing labored today per daughjter  Objective: Filed Vitals:   04/15/13 0620 04/15/13 1428 04/15/13 2219 04/16/13 0622  BP: 143/57 158/57 185/62 152/67  Pulse: 64 71 71   Temp: 99.1 F (37.3 C) 97.7 F (36.5 C) 100.4 F (38 C) 98.8 F (37.1 C)  TempSrc:  Oral Oral Oral  Resp: 15 16 16 18   Height:      Weight:      SpO2: 100% 100% 100% 100%    Intake/Output Summary (Last 24 hours) at 04/16/13 1208 Last data filed at 04/16/13 1610  Gross per 24 hour  Intake 2260.33 ml  Output   1050 ml  Net 1210.33 ml    Exam:  HENT:  Head: Atraumatic.  Nose: Nose normal.  Mouth/Throat: Oropharynx is clear and moist.  Eyes:  Conjunctivae are normal. Pupils are equal, round, and reactive to light. No scleral icterus.  Neck: Neck supple. No tracheal deviation present.  Cardiovascular: Normal rate, regular rhythm, normal heart sounds and intact distal pulses.  Pulmonary/Chest: Effort normal and breath sounds normal. No respiratory distress.  Abdominal: Soft. Normal appearance and bowel sounds are normal. She exhibits no distension. There is no tenderness.  Musculoskeletal: She exhibits no edema and no tenderness.  Neurological: She is alert. No cranial nerve deficit.    Data Reviewed: Basic Metabolic Panel:  Recent Labs Lab 04/12/13 0517 04/12/13 0627 04/13/13 0400 04/14/13 0648  NA 133* 135 133* 138  K 4.3 4.4 3.6 3.2*  CL 101 103 107 109  CO2 23  --  19 20  GLUCOSE 108* 106* 101* 90  BUN 14 13 14 12   CREATININE 0.64 0.80 0.63 0.63  CALCIUM 8.6  --  7.8* 7.8*    Liver Function Tests:  Recent Labs Lab 04/12/13 0517  AST 14  ALT 5  ALKPHOS 57  BILITOT 0.3  PROT 6.0  ALBUMIN 2.1*   No results found for this basename: LIPASE, AMYLASE,  in the last 168 hours No results found for this basename: AMMONIA,  in the last 168 hours  CBC:  Recent Labs Lab 04/12/13 0540 04/12/13 0627 04/13/13 0400 04/14/13 0648  WBC 7.8  --  9.5 5.5  HGB 7.1* 9.5* 9.1* 8.5*  HCT 22.5* 28.0* 26.7* 25.9*  MCV 74.3*  --  76.1* 76.9*  PLT 196  --  168 165    Cardiac Enzymes: No results found for this basename: CKTOTAL, CKMB, CKMBINDEX, TROPONINI,  in the last 168 hours BNP (last 3 results) No results found for this basename: PROBNP,  in the last 8760 hours   CBG:  Recent Labs Lab 04/12/13 0525  GLUCAP 119*    Recent Results (from the past 240 hour(s))  URINE CULTURE     Status: None   Collection Time    04/12/13  5:54 AM      Result Value Range Status   Specimen Description URINE, CLEAN CATCH   Final   Special Requests CX ADDED AT 0627 ON [id number]   Final   Culture  Setup Time     Final   Value:  04/12/2013 13:39     Performed at [facility]   Colony Count     Final   Value: >=100,000 COLONIES/ML     Performed at [facility]   Culture     Final   Value: ESCHERICHIA COLI     Performed at [facility]   Report Status 04/13/2013 FINAL   Final   Organism ID, Bacteria ESCHERICHIA COLI   Final  MRSA PCR SCREENING     Status: None   Collection Time    04/12/13  9:03 AM      Result Value Range Status   MRSA by PCR NEGATIVE  NEGATIVE Final   Comment:  The GeneXpert MRSA Assay (FDA     approved for NASAL specimens     only), is one component of a     comprehensive MRSA colonization     surveillance program. It is not     intended to diagnose MRSA     infection nor to guide or     monitor treatment for     MRSA infections.  CULTURE, BLOOD (ROUTINE X 2)     Status: None   Collection Time    04/12/13  5:09 PM      Result Value Range Status   Specimen Description BLOOD LEFT HAND   Final   Special Requests BOTTLES DRAWN AEROBIC AND ANAEROBIC 10CC   Final   Culture  Setup Time     Final   Value: 04/12/2013 22:53     Performed at [facility]   Culture     Final   Value:        BLOOD CULTURE RECEIVED NO GROWTH TO DATE CULTURE WILL BE HELD FOR 5 DAYS BEFORE ISSUING A FINAL NEGATIVE REPORT     Performed at [facility]   Report Status PENDING   Incomplete  CULTURE, BLOOD (ROUTINE X 2)     Status: None   Collection Time    04/12/13  5:17 PM      Result Value Range Status   Specimen Description BLOOD RIGHT HAND   Final   Special Requests BOTTLES DRAWN AEROBIC AND ANAEROBIC 10CC   Final   Culture  Setup Time     Final   Value: 04/12/2013 22:53     Performed at [facility]   Culture     Final   Value:        BLOOD CULTURE RECEIVED NO GROWTH TO DATE CULTURE WILL BE HELD FOR 5 DAYS BEFORE ISSUING A FINAL NEGATIVE REPORT     Performed at [facility]   Report Status PENDING   Incomplete     Studies: Ct Chest W  Contrast  04/12/2013   *RADIOLOGY REPORT*  Clinical Data:  Lung nodules.  Anemia  CT CHEST, ABDOMEN AND PELVIS WITH CONTRAST  Technique:  Multidetector CT imaging of the chest, abdomen and pelvis was performed following the standard protocol during bolus administration of intravenous contrast.  Contrast: 80mL OMNIPAQUE IOHEXOL 300 MG/ML  SOLN  Comparison:  Chest x-ray from today.  CT abdomen pelvis 03/27/2010. CT chest 02/24/2007  CT CHEST  Findings:  Numerous lung nodules are present bilaterally.  Most of the lung nodules have low density central necrosis and many have cavitated with numerous gas filled nodules present.  Several these have air-fluid levels.  The cavitary  nodules have moderately thick irregular walls.  Most of the nodules measure under 2 cm.  Index left lower lobe cavitary nodule measures 19 mm in diameter.  Solid nodule superior segment right lower lobe measures 17 mm.  No significant mediastinal or hilar adenopathy is identified.  Mild cardiac enlargement.  Small pleural effusions bilaterally. Mild atelectasis in the lung bases.  Negative for edema or pneumonia.  No bony destructive process.  No fracture in the thoracic spine.  14 mm mass distal esophagus with associated hiatal hernia.  IMPRESSION: Widespread pulmonary nodules.  Many these are necrotic and many are cavitary.  These could be due to infection such as septic emboli. Metastatic disease also is a consideration.  No evidence of adenopathy.  14 mm mass in the distal esophagus associated with a small  hiatal hernia.  This could be a neoplasm of the esophagus versus a benign neoplasm or mucosal thickening related to hiatal hernia.  CT ABDOMEN AND PELVIS  Findings:  The patient is markedly constipated with a large amount of stool throughout dilated colon.  There is a rectal impaction. No evidence of bowel obstruction.  Colonic ileus is present.  Liver gallbladder and bile ducts are normal.  Pancreas and spleen are normal.  The left kidney  shows decreased perfusion compared to the right kidney.  There is no hydronephrosis.  There is a 7 mm nonobstructing midpole stone.  Hypoperfusion of the left kidney may be due to pyelonephritis.  Alternately, there is atherosclerotic calcification in the left renal artery origin which could be a cause for hypoperfusion of the left kidney is normal in size and infection is favored.  The right kidney shows no obstruction or mass or stone.  Heavily calcified aorta and iliac arteries without aortic aneurysm. No adenopathy or free fluid.  No lumbar spine fracture or bony destructive lesion.  IMPRESSION: Mass lesion distal esophagus see above CT chest report  Severe constipation and rectal impaction with colonic ileus.  Hypoperfusion of the left kidney.  This may be due to pyelonephritis.  There is a nonobstructing left renal calculus.   Original Report Authenticated By: Janeece Riggers, M.D.   Ct Abdomen Pelvis W Contrast  04/12/2013   *RADIOLOGY REPORT*  Clinical Data:  Lung nodules.  Anemia  CT CHEST, ABDOMEN AND PELVIS WITH CONTRAST  Technique:  Multidetector CT imaging of the chest, abdomen and pelvis was performed following the standard protocol during bolus administration of intravenous contrast.  Contrast: 80mL OMNIPAQUE IOHEXOL 300 MG/ML  SOLN  Comparison:  Chest x-ray from today.  CT abdomen pelvis 03/27/2010. CT chest 02/24/2007  CT CHEST  Findings:  Numerous lung nodules are present bilaterally.  Most of the lung nodules have low density central necrosis and many have cavitated with numerous gas filled nodules present.  Several these have air-fluid levels.  The cavitary  nodules have moderately thick irregular walls.  Most of the nodules measure under 2 cm.  Index left lower lobe cavitary nodule measures 19 mm in diameter.  Solid nodule superior segment right lower lobe measures 17 mm.  No significant mediastinal or hilar adenopathy is identified.  Mild cardiac enlargement.  Small pleural effusions bilaterally.  Mild atelectasis in the lung bases.  Negative for edema or pneumonia.  No bony destructive process.  No fracture in the thoracic spine.  14 mm mass distal esophagus with associated hiatal hernia.  IMPRESSION: Widespread pulmonary nodules.  Many these are necrotic and many are cavitary.  These could be due to infection such as septic emboli. Metastatic disease also is a consideration.  No evidence of adenopathy.  14 mm mass in the distal esophagus associated with a small hiatal hernia.  This could be a neoplasm of the esophagus versus a benign neoplasm or mucosal thickening related to hiatal hernia.  CT ABDOMEN AND PELVIS  Findings:  The patient is markedly constipated with a large amount of stool throughout dilated colon.  There is a rectal impaction. No evidence of bowel obstruction.  Colonic ileus is present.  Liver gallbladder and bile ducts are normal.  Pancreas and spleen are normal.  The left kidney shows decreased perfusion compared to the right kidney.  There is no hydronephrosis.  There is a 7 mm nonobstructing midpole stone.  Hypoperfusion of the left kidney may be due to pyelonephritis.  Alternately,  there is atherosclerotic calcification in the left renal artery origin which could be a cause for hypoperfusion of the left kidney is normal in size and infection is favored.  The right kidney shows no obstruction or mass or stone.  Heavily calcified aorta and iliac arteries without aortic aneurysm. No adenopathy or free fluid.  No lumbar spine fracture or bony destructive lesion.  IMPRESSION: Mass lesion distal esophagus see above CT chest report  Severe constipation and rectal impaction with colonic ileus.  Hypoperfusion of the left kidney.  This may be due to pyelonephritis.  There is a nonobstructing left renal calculus.   Original Report Authenticated By: Janeece Riggers, M.D.   Dg Chest Port 1 View  04/16/2013   *RADIOLOGY REPORT*  Clinical Data: Fever.  Question pneumonia.  PORTABLE CHEST - 1 VIEW   Comparison: 04/12/2013  Findings: 1129 hours. The cardiopericardial silhouette is enlarged. Interval increase in retrocardiac collapse / consolidation. Pulmonary vascular congestion noted with diffuse bilateral interstitial opacity, right greater than left.  The new interstitial and basilar airspace disease obscures many of the nodular densities seen on the previous study.  IMPRESSION: Worsening diffuse interstitial and bibasilar airspace disease. Imaging features could be related to edema or progressive infection.  The multiple nodular opacities seen on the previous study have become largely obscured by the more diffuse lung disease on today's exam.   Original Report Authenticated By: Kennith Center, M.D.   Dg Chest Port 1 View  04/12/2013   *RADIOLOGY REPORT*  Clinical Data: Altered mental status.  PORTABLE CHEST - 1 VIEW  Comparison: CT chest 02/24/2007 and single view of the chest 03/27/2010.  Findings: Multiple bilateral nodular opacities are identified.  One of the largest is in the left upper lobe and measures 1.4 cm in diameter.  No consolidative process, pneumothorax or effusion is seen.  No focal bony abnormality is identified.  IMPRESSION: Bilateral pulmonary nodules worrisome for metastatic disease.   Original Report Authenticated By: Holley Dexter, M.D.    Scheduled Meds: . antiseptic oral rinse  15 mL Mouth Rinse BID  . brimonidine  1 drop Both Eyes BID  . docusate  100 mg Oral BID  . donepezil  10 mg Oral QHS  . enoxaparin (LOVENOX) injection  30 mg Subcutaneous Q24H  . feeding supplement  237 mL Oral BID BM  . losartan  12.5 mg Oral Daily  . piperacillin-tazobactam  3.375 g Intravenous Q8H  . polyethylene glycol  17 g Oral Daily  . sodium chloride  3 mL Intravenous Q12H  . vancomycin  750 mg Intravenous Q24H   Continuous Infusions: . 0.9 % NaCl with KCl 40 mEq / L 50 mL/hr at 04/16/13 0200    Principal Problem:   Sepsis with metabolic encephalopathy Active Problems:    Dementia   Hypertension   Hematuria   Anemia   UTI (lower urinary tract infection)   Protein-calorie malnutrition, severe    Time spent: 40 minutes   [facility]  [facility] Pager [phone]. If 8PM-8AM, please contact night-coverage at [email or web address], password [facility] 04/16/2013, 12:08 PM  LOS: 4 days

## 2013-04-17 LAB — BASIC METABOLIC PANEL
GFR calc Af Amer: >90 mL/min (ref >90)
GFR calc non Af Amer: 83 mL/min — ABNORMAL LOW (ref >90)
Potassium: 3.4 mEq/L — ABNORMAL LOW (ref 3.5–5.1)
Sodium: 137 mEq/L (ref 135–145)

## 2013-04-17 LAB — CBC
Hemoglobin: 8.4 g/dL — ABNORMAL LOW (ref 12.0–15.0)
RBC: 3.23 MIL/uL — ABNORMAL LOW (ref 3.87–5.11)

## 2013-04-17 NOTE — Progress Notes (Signed)
SLP Cancellation Note  Patient Details Name: Jean Shaw MRN: [id number] DOB: Dec 25, 1928   Cancelled treatment:       Reason Eval/Treat Not Completed: Fatigue/lethargy limiting ability to participate. SLP will f/u tomorrow if needed after meeting.    Gyneth Hubka, Riley Nearing 04/17/2013, 3:19 PM

## 2013-04-17 NOTE — Progress Notes (Signed)
[facility] PROGRESS NOTE  Jean Shaw WUJ:[id number] DOB: 01/04/1929 DOA: 04/12/2013 PCP: Florentina Jenny, MD  Assessment/Plan: Principal Problem:   Sepsis with metabolic encephalopathy Active Problems:   Dementia   Hypertension   Hematuria   Anemia   UTI (lower urinary tract infection)   Protein-calorie malnutrition, severe    Sepsis with metabolic encephalopathy  Hypotension, fever and lactic acidosis meeting criteria for sepsis  Likely secondary to UTI.  Continues to have intermittent fever, suspect aspiration  IV hydration with NS. Discontinued because of labored breathing although patient is on room air neurochecks unchanged  Continue IV vanco and zosyn given CT findings outlined below.  urine cx shows E COLI , blood cultures negative thus far Speech therapy consultation for possible aspiration Discussed with the daughter that the antibiotics will be discontinued after total of 7 days   Lung nodules  seen on CXR concerning for mets. Daughter reports hx of smoking in past. CT chest showed multiple bilateral lung nodules suggestive of mets vs ? Septic emboli. Also showed distal esophageal mass. Discussed with daughter . Does not want any intervention. Given her advanced dementia and poor functional status , she is likely a poor candidate for any treatment.  CT abd and pelvis shows some colonic ileus , resolved  Repeat chest x-ray worsening infiltrates,  Consult palliative care for goals of care meeting as the patient is not improving     Dementia  Severe. bed bound with blindness and hearing impairment. Recognizes daughter and her grandson but not abel to carry out full conversations. Fully dependent with ADLs    UTI (lower urinary tract infection)  Has hx of chronic UTI and is on nitrofurantoin at home. CT suggests pyelonephritis of left kidney . Also  Monitor I/O  Severe constipation  has rectal impaction with colonic ileus, resolved. 2-3 BM yesterday    Hypertension  Hold BP meds  Hematuria  Has hx of kidney stones . Has a nonobstructive. Left renal calculus. Monitor H&H. Will plan on further w/up depending upon CT result   Anemia  given 1 u PRBC in ED. hemoglobin 8.4   Code Status: DNR  Family Communication: daugther at bedside. Discussed goals of care. Does not want intervention for biopsy at this time and understands she is not a good candidate for treatment if lesions are cancerous. Wishes to have her treated for infection , IV fluids , transfusions and monitor only if she is improving.     Brief narrative:  79 y/o female with dementia, HTN, renals tones, chronic UTI on abx, brought in by daughter as she appeared febrile this morning and had poor responsiveness associated with some chills. Temp at home was 56F..she also noticed blood in her diapers this morning. Patient was at her baseline until this morning. At baseline she has moderate to severe dementia , is able to carry out conversations mostly at yes or no, fully bedbound and dependent with all her ADLs, awake and alert , has hearing impairment and legally blind.  Daughter called EMS and was brought to the hospital. She had no nausea, vomiting, diarrhea, shortness of breath , c/o pain anywhere, recent fall , poor appetite or worsening mental status until his morning.    Consultants:  Procedures:  Antibiotics:  Vanco, zosyn  HPI/Subjective:  Breathing labored today per daughjter   Objective: Filed Vitals:   04/16/13 0622 04/16/13 1348 04/16/13 2232 04/17/13 0536  BP: 152/67 164/62 165/68 152/73  Pulse:  69 68 70  Temp: 98.8 F (  37.1 C) 99.5 F (37.5 C) 99.5 F (37.5 C) 98.5 F (36.9 C)  TempSrc: Oral  Axillary Oral  Resp: 18 18 16 16   Height:      Weight:      SpO2: 100% 99% 99% 98%    Intake/Output Summary (Last 24 hours) at 04/17/13 0908 Last data filed at 04/17/13 0536  Gross per 24 hour  Intake    120 ml  Output   4050 ml  Net  -3930 ml     Exam:  HENT:  Head: Atraumatic.  Nose: Nose normal.  Mouth/Throat: Oropharynx is clear and moist.  Eyes: Conjunctivae are normal. Pupils are equal, round, and reactive to light. No scleral icterus.  Neck: Neck supple. No tracheal deviation present.  Cardiovascular: Normal rate, regular rhythm, normal heart sounds and intact distal pulses.  Pulmonary/Chest: Effort normal and breath sounds normal. No respiratory distress.  Abdominal: Soft. Normal appearance and bowel sounds are normal. She exhibits no distension. There is no tenderness.  Musculoskeletal: She exhibits no edema and no tenderness.  Neurological: She is alert. No cranial nerve deficit.    Data Reviewed: Basic Metabolic Panel:  Recent Labs Lab 04/12/13 0517 04/12/13 0627 04/13/13 0400 04/14/13 0648 04/16/13 1230 04/17/13 0509  NA 133* 135 133* 138 136 137  K 4.3 4.4 3.6 3.2* 3.9 3.4*  CL 101 103 107 109 109 107  CO2 23  --  19 20 21 22   GLUCOSE 108* 106* 101* 90 104* 90  BUN 14 13 14 12 11 14   CREATININE 0.64 0.80 0.63 0.63 0.58 0.61  CALCIUM 8.6  --  7.8* 7.8* 8.3* 8.7    Liver Function Tests:  Recent Labs Lab 04/12/13 0517 04/16/13 1230  AST 14 16  ALT 5 13  ALKPHOS 57 52  BILITOT 0.3 0.3  PROT 6.0 5.5*  ALBUMIN 2.1* 1.5*   No results found for this basename: LIPASE, AMYLASE,  in the last 168 hours No results found for this basename: AMMONIA,  in the last 168 hours  CBC:  Recent Labs Lab 04/12/13 0540 04/12/13 0627 04/13/13 0400 04/14/13 0648 04/17/13 0509  WBC 7.8  --  9.5 5.5 6.0  HGB 7.1* 9.5* 9.1* 8.5* 8.4*  HCT 22.5* 28.0* 26.7* 25.9* 25.0*  MCV 74.3*  --  76.1* 76.9* 77.4*  PLT 196  --  168 165 218    Cardiac Enzymes: No results found for this basename: CKTOTAL, CKMB, CKMBINDEX, TROPONINI,  in the last 168 hours BNP (last 3 results) No results found for this basename: PROBNP,  in the last 8760 hours   CBG:  Recent Labs Lab 04/12/13 0525  GLUCAP 119*    Recent  Results (from the past 240 hour(s))  URINE CULTURE     Status: None   Collection Time    04/12/13  5:54 AM      Result Value Range Status   Specimen Description URINE, CLEAN CATCH   Final   Special Requests CX ADDED AT 0627 ON [id number]   Final   Culture  Setup Time     Final   Value: 04/12/2013 13:39     Performed at [facility]   Colony Count     Final   Value: >=100,000 COLONIES/ML     Performed at [facility]   Culture     Final   Value: ESCHERICHIA COLI     Performed at [facility]   Report Status 04/13/2013 FINAL   Final   Organism  ID, Bacteria ESCHERICHIA COLI   Final  MRSA PCR SCREENING     Status: None   Collection Time    04/12/13  9:03 AM      Result Value Range Status   MRSA by PCR NEGATIVE  NEGATIVE Final   Comment:            The GeneXpert MRSA Assay (FDA     approved for NASAL specimens     only), is one component of a     comprehensive MRSA colonization     surveillance program. It is not     intended to diagnose MRSA     infection nor to guide or     monitor treatment for     MRSA infections.  CULTURE, BLOOD (ROUTINE X 2)     Status: None   Collection Time    04/12/13  5:09 PM      Result Value Range Status   Specimen Description BLOOD LEFT HAND   Final   Special Requests BOTTLES DRAWN AEROBIC AND ANAEROBIC 10CC   Final   Culture  Setup Time     Final   Value: 04/12/2013 22:53     Performed at [facility]   Culture     Final   Value:        BLOOD CULTURE RECEIVED NO GROWTH TO DATE CULTURE WILL BE HELD FOR 5 DAYS BEFORE ISSUING A FINAL NEGATIVE REPORT     Performed at [facility]   Report Status PENDING   Incomplete  CULTURE, BLOOD (ROUTINE X 2)     Status: None   Collection Time    04/12/13  5:17 PM      Result Value Range Status   Specimen Description BLOOD RIGHT HAND   Final   Special Requests BOTTLES DRAWN AEROBIC AND ANAEROBIC 10CC   Final   Culture  Setup Time     Final   Value: 04/12/2013 22:53      Performed at [facility]   Culture     Final   Value:        BLOOD CULTURE RECEIVED NO GROWTH TO DATE CULTURE WILL BE HELD FOR 5 DAYS BEFORE ISSUING A FINAL NEGATIVE REPORT     Performed at [facility]   Report Status PENDING   Incomplete     Studies: Ct Chest W Contrast  04/12/2013   *RADIOLOGY REPORT*  Clinical Data:  Lung nodules.  Anemia  CT CHEST, ABDOMEN AND PELVIS WITH CONTRAST  Technique:  Multidetector CT imaging of the chest, abdomen and pelvis was performed following the standard protocol during bolus administration of intravenous contrast.  Contrast: 80mL OMNIPAQUE IOHEXOL 300 MG/ML  SOLN  Comparison:  Chest x-ray from today.  CT abdomen pelvis 03/27/2010. CT chest 02/24/2007  CT CHEST  Findings:  Numerous lung nodules are present bilaterally.  Most of the lung nodules have low density central necrosis and many have cavitated with numerous gas filled nodules present.  Several these have air-fluid levels.  The cavitary  nodules have moderately thick irregular walls.  Most of the nodules measure under 2 cm.  Index left lower lobe cavitary nodule measures 19 mm in diameter.  Solid nodule superior segment right lower lobe measures 17 mm.  No significant mediastinal or hilar adenopathy is identified.  Mild cardiac enlargement.  Small pleural effusions bilaterally. Mild atelectasis in the lung bases.  Negative for edema or pneumonia.  No bony destructive process.  No fracture in the  thoracic spine.  14 mm mass distal esophagus with associated hiatal hernia.  IMPRESSION: Widespread pulmonary nodules.  Many these are necrotic and many are cavitary.  These could be due to infection such as septic emboli. Metastatic disease also is a consideration.  No evidence of adenopathy.  14 mm mass in the distal esophagus associated with a small hiatal hernia.  This could be a neoplasm of the esophagus versus a benign neoplasm or mucosal thickening related to hiatal hernia.  CT ABDOMEN AND PELVIS   Findings:  The patient is markedly constipated with a large amount of stool throughout dilated colon.  There is a rectal impaction. No evidence of bowel obstruction.  Colonic ileus is present.  Liver gallbladder and bile ducts are normal.  Pancreas and spleen are normal.  The left kidney shows decreased perfusion compared to the right kidney.  There is no hydronephrosis.  There is a 7 mm nonobstructing midpole stone.  Hypoperfusion of the left kidney may be due to pyelonephritis.  Alternately, there is atherosclerotic calcification in the left renal artery origin which could be a cause for hypoperfusion of the left kidney is normal in size and infection is favored.  The right kidney shows no obstruction or mass or stone.  Heavily calcified aorta and iliac arteries without aortic aneurysm. No adenopathy or free fluid.  No lumbar spine fracture or bony destructive lesion.  IMPRESSION: Mass lesion distal esophagus see above CT chest report  Severe constipation and rectal impaction with colonic ileus.  Hypoperfusion of the left kidney.  This may be due to pyelonephritis.  There is a nonobstructing left renal calculus.   Original Report Authenticated By: Janeece Riggers, M.D.   Ct Abdomen Pelvis W Contrast  04/12/2013   *RADIOLOGY REPORT*  Clinical Data:  Lung nodules.  Anemia  CT CHEST, ABDOMEN AND PELVIS WITH CONTRAST  Technique:  Multidetector CT imaging of the chest, abdomen and pelvis was performed following the standard protocol during bolus administration of intravenous contrast.  Contrast: 80mL OMNIPAQUE IOHEXOL 300 MG/ML  SOLN  Comparison:  Chest x-ray from today.  CT abdomen pelvis 03/27/2010. CT chest 02/24/2007  CT CHEST  Findings:  Numerous lung nodules are present bilaterally.  Most of the lung nodules have low density central necrosis and many have cavitated with numerous gas filled nodules present.  Several these have air-fluid levels.  The cavitary  nodules have moderately thick irregular walls.  Most of  the nodules measure under 2 cm.  Index left lower lobe cavitary nodule measures 19 mm in diameter.  Solid nodule superior segment right lower lobe measures 17 mm.  No significant mediastinal or hilar adenopathy is identified.  Mild cardiac enlargement.  Small pleural effusions bilaterally. Mild atelectasis in the lung bases.  Negative for edema or pneumonia.  No bony destructive process.  No fracture in the thoracic spine.  14 mm mass distal esophagus with associated hiatal hernia.  IMPRESSION: Widespread pulmonary nodules.  Many these are necrotic and many are cavitary.  These could be due to infection such as septic emboli. Metastatic disease also is a consideration.  No evidence of adenopathy.  14 mm mass in the distal esophagus associated with a small hiatal hernia.  This could be a neoplasm of the esophagus versus a benign neoplasm or mucosal thickening related to hiatal hernia.  CT ABDOMEN AND PELVIS  Findings:  The patient is markedly constipated with a large amount of stool throughout dilated colon.  There is a rectal impaction. No evidence of  bowel obstruction.  Colonic ileus is present.  Liver gallbladder and bile ducts are normal.  Pancreas and spleen are normal.  The left kidney shows decreased perfusion compared to the right kidney.  There is no hydronephrosis.  There is a 7 mm nonobstructing midpole stone.  Hypoperfusion of the left kidney may be due to pyelonephritis.  Alternately, there is atherosclerotic calcification in the left renal artery origin which could be a cause for hypoperfusion of the left kidney is normal in size and infection is favored.  The right kidney shows no obstruction or mass or stone.  Heavily calcified aorta and iliac arteries without aortic aneurysm. No adenopathy or free fluid.  No lumbar spine fracture or bony destructive lesion.  IMPRESSION: Mass lesion distal esophagus see above CT chest report  Severe constipation and rectal impaction with colonic ileus.  Hypoperfusion  of the left kidney.  This may be due to pyelonephritis.  There is a nonobstructing left renal calculus.   Original Report Authenticated By: Janeece Riggers, M.D.   Dg Chest Port 1 View  04/16/2013   *RADIOLOGY REPORT*  Clinical Data: Fever.  Question pneumonia.  PORTABLE CHEST - 1 VIEW  Comparison: 04/12/2013  Findings: 1129 hours. The cardiopericardial silhouette is enlarged. Interval increase in retrocardiac collapse / consolidation. Pulmonary vascular congestion noted with diffuse bilateral interstitial opacity, right greater than left.  The new interstitial and basilar airspace disease obscures many of the nodular densities seen on the previous study.  IMPRESSION: Worsening diffuse interstitial and bibasilar airspace disease. Imaging features could be related to edema or progressive infection.  The multiple nodular opacities seen on the previous study have become largely obscured by the more diffuse lung disease on today's exam.   Original Report Authenticated By: Kennith Center, M.D.   Dg Chest Port 1 View  04/12/2013   *RADIOLOGY REPORT*  Clinical Data: Altered mental status.  PORTABLE CHEST - 1 VIEW  Comparison: CT chest 02/24/2007 and single view of the chest 03/27/2010.  Findings: Multiple bilateral nodular opacities are identified.  One of the largest is in the left upper lobe and measures 1.4 cm in diameter.  No consolidative process, pneumothorax or effusion is seen.  No focal bony abnormality is identified.  IMPRESSION: Bilateral pulmonary nodules worrisome for metastatic disease.   Original Report Authenticated By: Holley Dexter, M.D.    Scheduled Meds: . antiseptic oral rinse  15 mL Mouth Rinse BID  . brimonidine  1 drop Both Eyes BID  . docusate  100 mg Oral BID  . donepezil  10 mg Oral QHS  . enoxaparin (LOVENOX) injection  30 mg Subcutaneous Q24H  . feeding supplement  237 mL Oral BID BM  . losartan  12.5 mg Oral Daily  . piperacillin-tazobactam  3.375 g Intravenous Q8H  . polyethylene  glycol  17 g Oral Daily  . sodium chloride  3 mL Intravenous Q12H  . vancomycin  750 mg Intravenous Q24H   Continuous Infusions:   Principal Problem:   Sepsis with metabolic encephalopathy Active Problems:   Dementia   Hypertension   Hematuria   Anemia   UTI (lower urinary tract infection)   Protein-calorie malnutrition, severe    Time spent: 40 minutes   [facility]  [facility] Pager [phone]. If 8PM-8AM, please contact night-coverage at [email or web address], password [facility] 04/17/2013, 9:08 AM  LOS: 5 days

## 2013-04-17 NOTE — Progress Notes (Signed)
Thank you for consulting the Palliative Medicine Team at [facility] to meet your patient's and family's needs.   The reason that you asked [place] to see your patient is  For GOC  We have scheduled your patient for a meeting: 8/10  8 am  The Surrogate decision make is: Earleen Newport , daughter Contact information:[phone]  Other family members that need to be present: NA    Your patient is able/unable to participate: No  Additional Narrative:  Pt unable to hear of see.

## 2013-04-18 DIAGNOSIS — Z515 Encounter for palliative care: Secondary | ICD-10-CM

## 2013-04-18 DIAGNOSIS — I10 Essential (primary) hypertension: Secondary | ICD-10-CM

## 2013-04-18 DIAGNOSIS — E43 Unspecified severe protein-calorie malnutrition: Secondary | ICD-10-CM

## 2013-04-18 LAB — CULTURE, BLOOD (ROUTINE X 2): Culture: NO GROWTH

## 2013-04-18 MED ORDER — PIPERACILLIN-TAZOBACTAM 3.375 G IVPB
3.3750 g | Freq: Three times a day (TID) | INTRAVENOUS | Status: DC
  Administered 2013-04-19: 3.375 g via INTRAVENOUS
  Filled 2013-04-18 (×4): qty 50

## 2013-04-18 NOTE — Progress Notes (Signed)
SLP Cancellation Note  Patient Details Name: Jean Shaw MRN: [id number] DOB: 09-May-1929   Cancelled treatment:  ST to f/u for POC on 04/19/13. Moreen Fowler MS, CCC-SLP [phone] [facility] 04/18/2013, 4:05 PM

## 2013-04-18 NOTE — Progress Notes (Signed)
ANTIBIOTIC CONSULT NOTE - Follow-Up  Pharmacy Consult for Vancomycin Indication: Pyelonephritis; ?PNA   No Known Allergies  Patient Measurements: Height: 5\' 5"  (165.1 cm) Weight: 98 lb 8.7 oz (44.7 kg) IBW/kg (Calculated) : 57   Vital Signs: Temp: 98.1 F (36.7 C) (08/10 0530) Temp src: Oral (08/10 0530) BP: 166/75 mmHg (08/10 0530) Pulse Rate: 64 (08/10 0530) Intake/Output from previous day: 08/09 0701 - 08/10 0700 In: 250 [IV Piggyback:250] Out: 1575 [Urine:1575] Intake/Output from this shift:    Labs:  Recent Labs  04/16/13 1230 04/17/13 0509  WBC  --  6.0  HGB  --  8.4*  PLT  --  218  CREATININE 0.58 0.61   Estimated Creatinine Clearance: 38.9 ml/min (by C-G formula based on Cr of 0.61). No results found for this basename: Rolm Gala, VANCORANDOM, GENTTROUGH, GENTPEAK, GENTRANDOM, TOBRATROUGH, TOBRAPEAK, TOBRARND, AMIKACINPEAK, AMIKACINTROU, AMIKACIN,  in the last 72 hours   Microbiology: Recent Results (from the past 720 hour(s))  URINE CULTURE     Status: None   Collection Time    04/12/13  5:54 AM      Result Value Range Status   Specimen Description URINE, CLEAN CATCH   Final   Special Requests CX ADDED AT 0627 ON [id number]   Final   Culture  Setup Time     Final   Value: 04/12/2013 13:39     Performed at [facility]   Colony Count     Final   Value: >=100,000 COLONIES/ML     Performed at [facility]   Culture     Final   Value: ESCHERICHIA COLI     Performed at [facility]   Report Status 04/13/2013 FINAL   Final   Organism ID, Bacteria ESCHERICHIA COLI   Final  MRSA PCR SCREENING     Status: None   Collection Time    04/12/13  9:03 AM      Result Value Range Status   MRSA by PCR NEGATIVE  NEGATIVE Final   Comment:            The GeneXpert MRSA Assay (FDA     approved for NASAL specimens     only), is one component of a     comprehensive MRSA colonization     surveillance program. It is not     intended  to diagnose MRSA     infection nor to guide or     monitor treatment for     MRSA infections.  CULTURE, BLOOD (ROUTINE X 2)     Status: None   Collection Time    04/12/13  5:09 PM      Result Value Range Status   Specimen Description BLOOD LEFT HAND   Final   Special Requests BOTTLES DRAWN AEROBIC AND ANAEROBIC 10CC   Final   Culture  Setup Time     Final   Value: 04/12/2013 22:53     Performed at [facility]   Culture     Final   Value:        BLOOD CULTURE RECEIVED NO GROWTH TO DATE CULTURE WILL BE HELD FOR 5 DAYS BEFORE ISSUING A FINAL NEGATIVE REPORT     Performed at [facility]   Report Status PENDING   Incomplete  CULTURE, BLOOD (ROUTINE X 2)     Status: None   Collection Time    04/12/13  5:17 PM      Result Value Range Status   Specimen  Description BLOOD RIGHT HAND   Final   Special Requests BOTTLES DRAWN AEROBIC AND ANAEROBIC 10CC   Final   Culture  Setup Time     Final   Value: 04/12/2013 22:53     Performed at [facility]   Culture     Final   Value:        BLOOD CULTURE RECEIVED NO GROWTH TO DATE CULTURE WILL BE HELD FOR 5 DAYS BEFORE ISSUING A FINAL NEGATIVE REPORT     Performed at [facility]   Report Status PENDING   Incomplete   Assessment: 83yof on Vanc and Zosyn (Day #7) for pyelonephritis. CT chest showed multiple bilateral lung nodules suggestive of mets vs ? septic emboli; Noted plan to d/c abx at 7 days (8/10). Tmax 100.6, wbc wnl; chronic UTI - on nitrofurantoin PTA   Vanc 8/4>> Zosyn 8/4>> CTX x 1 dose (8/4)  8/4: BCx>>NGTD 8/4: Urine>> >100,000 EColi R amp, nitro 8/4: MRSA PCR neg   Goal of Therapy:  Vancomycin trough level 15-20 mcg/ml  Plan:  1. Vancomycin 750mg  IV q24h 2. Continue Zosyn 3.375g IV q8h - infuse over 4 hours 3. F/u d/c of abx today - will not check vanc trough with hopeful d/c soon  Christoper Fabian, PharmD, BCPS Clinical pharmacist, pager [phone] 04/18/2013,7:57 AM

## 2013-04-18 NOTE — Progress Notes (Addendum)
[facility] PROGRESS NOTE  Jean Shaw ZOX:[id number] DOB: 1929/02/07 DOA: 04/12/2013 PCP: Florentina Jenny, MD  Assessment/Plan: Principal Problem:   Sepsis with metabolic encephalopathy Active Problems:   Dementia   Hypertension   Hematuria   Anemia   UTI (lower urinary tract infection)   Protein-calorie malnutrition, severe   Sepsis with metabolic encephalopathy  Persistent fever despite antibiotics  Chronic aspiration, UTI, pulmonary nodules, underlying malignancy? Continues to have intermittent fever, suspect aspiration  IV hydration with NS. Discontinued because of labored breathing although patient is on room air  neurochecks unchanged  Continue IV vanco and zosyn given CT findings outlined below. urine cx shows E COLI , blood cultures negative thus far  Speech therapy unable to assess due to lethargy Discussed with the daughter that the antibiotics will be discontinued after total of 7 days , last dose today   Lung nodules  seen on CXR concerning for mets. Daughter reports hx of smoking in past. CT chest showed multiple bilateral lung nodules suggestive of mets vs ? Septic emboli. Also showed distal esophageal mass. Discussed with daughter . Does not want any intervention. Given her advanced dementia and poor functional status , she is likely a poor candidate for any treatment.  CT abd and pelvis shows some colonic ileus , resolved  Repeat chest x-ray worsening infiltrates,  Does not want hospice services at this time  Dementia  Severe. bed bound with blindness and hearing impairment. Recognizes daughter and her grandson but not abel to carry out full conversations. Fully dependent with ADLs   UTI (lower urinary tract infection)  Has hx of chronic UTI and is on nitrofurantoin at home. CT suggests pyelonephritis of left kidney . Also  Monitor I/O  Severe constipation  has rectal impaction with colonic ileus, now resolved  Hypertension  Hold BP meds   Hematuria  Has hx of kidney stones . Has a nonobstructive. Left renal calculus. Monitor H&H. Will plan on further w/up depending upon CT result  Anemia  given 1 u PRBC in ED. hemoglobin 8.4    Code Status: DNR  Family Communication: daugther at bedside. Discussed goals of care. Does not want intervention for biopsy at this time and understands she is not a good candidate for treatment if lesions are cancerous. Wishes to have her treated for infection , IV fluids , transfusions and monitor only if she is improving.    Brief narrative:  79 y/o female with dementia, HTN, renals tones, chronic UTI on abx, brought in by daughter as she appeared febrile this morning and had poor responsiveness associated with some chills. Temp at home was 55F..she also noticed blood in her diapers this morning. Patient was at her baseline until this morning. At baseline she has moderate to severe dementia , is able to carry out conversations mostly at yes or no, fully bedbound and dependent with all her ADLs, awake and alert , has hearing impairment and legally blind.  Daughter called EMS and was brought to the hospital. She had no nausea, vomiting, diarrhea, shortness of breath , c/o pain anywhere, recent fall , poor appetite or worsening mental status until his morning.    Consultants:  Procedures:  Antibiotics:  Vanco, zosyn    HPI/Subjective: Continues to have persistent fever Somnolent , noncommunicative   Objective: Filed Vitals:   04/17/13 0536 04/17/13 1506 04/17/13 2103 04/18/13 0530  BP: 152/73 136/61 145/65 166/75  Pulse: 70 71 81 64  Temp: 98.5 F (36.9 C) 100.6 F (38.1 C) 100  F (37.8 C) 98.1 F (36.7 C)  TempSrc: Oral Axillary Oral Oral  Resp: 16 18 16 16   Height:      Weight:      SpO2: 98% 98% 100% 99%    Intake/Output Summary (Last 24 hours) at 04/18/13 0959 Last data filed at 04/18/13 0530  Gross per 24 hour  Intake    200 ml  Output   1575 ml  Net  -1375 ml     Exam:  General: Somnolent but arousable. Patient is very hard of hearing and hears best in her left ear she does not have the ability to see but can open her eyes.  HEENT: Pupils are opacified, mucous membranes are dry  Chest: Chest is decreased with anterior clearness no rhonchi rales or wheezes are noted  CVS: Regular rate and rhythm distant positive S1 and S2-1 nutritionist or S4  Abdomen: Scaphoid soft no grimace on palpation  Ext: Contracted lower extremities and upper extremities. Patient is agitated when you hold her hand to touch her hands. Slight edema of the feet  Neuro: Advanced stages of dementia among verbal and when she is verbal she is perseverating    Data Reviewed: Basic Metabolic Panel:  Recent Labs Lab 04/12/13 0517 04/12/13 0627 04/13/13 0400 04/14/13 0648 04/16/13 1230 04/17/13 0509  NA 133* 135 133* 138 136 137  K 4.3 4.4 3.6 3.2* 3.9 3.4*  CL 101 103 107 109 109 107  CO2 23  --  19 20 21 22   GLUCOSE 108* 106* 101* 90 104* 90  BUN 14 13 14 12 11 14   CREATININE 0.64 0.80 0.63 0.63 0.58 0.61  CALCIUM 8.6  --  7.8* 7.8* 8.3* 8.7    Liver Function Tests:  Recent Labs Lab 04/12/13 0517 04/16/13 1230  AST 14 16  ALT 5 13  ALKPHOS 57 52  BILITOT 0.3 0.3  PROT 6.0 5.5*  ALBUMIN 2.1* 1.5*   No results found for this basename: LIPASE, AMYLASE,  in the last 168 hours No results found for this basename: AMMONIA,  in the last 168 hours  CBC:  Recent Labs Lab 04/12/13 0540 04/12/13 0627 04/13/13 0400 04/14/13 0648 04/17/13 0509  WBC 7.8  --  9.5 5.5 6.0  HGB 7.1* 9.5* 9.1* 8.5* 8.4*  HCT 22.5* 28.0* 26.7* 25.9* 25.0*  MCV 74.3*  --  76.1* 76.9* 77.4*  PLT 196  --  168 165 218    Cardiac Enzymes: No results found for this basename: CKTOTAL, CKMB, CKMBINDEX, TROPONINI,  in the last 168 hours BNP (last 3 results) No results found for this basename: PROBNP,  in the last 8760 hours   CBG:  Recent Labs Lab 04/12/13 0525  GLUCAP 119*     Recent Results (from the past 240 hour(s))  URINE CULTURE     Status: None   Collection Time    04/12/13  5:54 AM      Result Value Range Status   Specimen Description URINE, CLEAN CATCH   Final   Special Requests CX ADDED AT 0627 ON [id number]   Final   Culture  Setup Time     Final   Value: 04/12/2013 13:39     Performed at [facility]   Colony Count     Final   Value: >=100,000 COLONIES/ML     Performed at [facility]   Culture     Final   Value: ESCHERICHIA COLI     Performed at [facility]  Report Status 04/13/2013 FINAL   Final   Organism ID, Bacteria ESCHERICHIA COLI   Final  MRSA PCR SCREENING     Status: None   Collection Time    04/12/13  9:03 AM      Result Value Range Status   MRSA by PCR NEGATIVE  NEGATIVE Final   Comment:            The GeneXpert MRSA Assay (FDA     approved for NASAL specimens     only), is one component of a     comprehensive MRSA colonization     surveillance program. It is not     intended to diagnose MRSA     infection nor to guide or     monitor treatment for     MRSA infections.  CULTURE, BLOOD (ROUTINE X 2)     Status: None   Collection Time    04/12/13  5:09 PM      Result Value Range Status   Specimen Description BLOOD LEFT HAND   Final   Special Requests BOTTLES DRAWN AEROBIC AND ANAEROBIC 10CC   Final   Culture  Setup Time     Final   Value: 04/12/2013 22:53     Performed at [facility]   Culture     Final   Value:        BLOOD CULTURE RECEIVED NO GROWTH TO DATE CULTURE WILL BE HELD FOR 5 DAYS BEFORE ISSUING A FINAL NEGATIVE REPORT     Performed at [facility]   Report Status PENDING   Incomplete  CULTURE, BLOOD (ROUTINE X 2)     Status: None   Collection Time    04/12/13  5:17 PM      Result Value Range Status   Specimen Description BLOOD RIGHT HAND   Final   Special Requests BOTTLES DRAWN AEROBIC AND ANAEROBIC 10CC   Final   Culture  Setup Time     Final   Value:  04/12/2013 22:53     Performed at [facility]   Culture     Final   Value:        BLOOD CULTURE RECEIVED NO GROWTH TO DATE CULTURE WILL BE HELD FOR 5 DAYS BEFORE ISSUING A FINAL NEGATIVE REPORT     Performed at [facility]   Report Status PENDING   Incomplete     Studies: Ct Chest W Contrast  04/12/2013   *RADIOLOGY REPORT*  Clinical Data:  Lung nodules.  Anemia  CT CHEST, ABDOMEN AND PELVIS WITH CONTRAST  Technique:  Multidetector CT imaging of the chest, abdomen and pelvis was performed following the standard protocol during bolus administration of intravenous contrast.  Contrast: 80mL OMNIPAQUE IOHEXOL 300 MG/ML  SOLN  Comparison:  Chest x-ray from today.  CT abdomen pelvis 03/27/2010. CT chest 02/24/2007  CT CHEST  Findings:  Numerous lung nodules are present bilaterally.  Most of the lung nodules have low density central necrosis and many have cavitated with numerous gas filled nodules present.  Several these have air-fluid levels.  The cavitary  nodules have moderately thick irregular walls.  Most of the nodules measure under 2 cm.  Index left lower lobe cavitary nodule measures 19 mm in diameter.  Solid nodule superior segment right lower lobe measures 17 mm.  No significant mediastinal or hilar adenopathy is identified.  Mild cardiac enlargement.  Small pleural effusions bilaterally. Mild atelectasis in the lung bases.  Negative for edema or pneumonia.  No bony destructive process.  No fracture in the thoracic spine.  14 mm mass distal esophagus with associated hiatal hernia.  IMPRESSION: Widespread pulmonary nodules.  Many these are necrotic and many are cavitary.  These could be due to infection such as septic emboli. Metastatic disease also is a consideration.  No evidence of adenopathy.  14 mm mass in the distal esophagus associated with a small hiatal hernia.  This could be a neoplasm of the esophagus versus a benign neoplasm or mucosal thickening related to hiatal hernia.  CT  ABDOMEN AND PELVIS  Findings:  The patient is markedly constipated with a large amount of stool throughout dilated colon.  There is a rectal impaction. No evidence of bowel obstruction.  Colonic ileus is present.  Liver gallbladder and bile ducts are normal.  Pancreas and spleen are normal.  The left kidney shows decreased perfusion compared to the right kidney.  There is no hydronephrosis.  There is a 7 mm nonobstructing midpole stone.  Hypoperfusion of the left kidney may be due to pyelonephritis.  Alternately, there is atherosclerotic calcification in the left renal artery origin which could be a cause for hypoperfusion of the left kidney is normal in size and infection is favored.  The right kidney shows no obstruction or mass or stone.  Heavily calcified aorta and iliac arteries without aortic aneurysm. No adenopathy or free fluid.  No lumbar spine fracture or bony destructive lesion.  IMPRESSION: Mass lesion distal esophagus see above CT chest report  Severe constipation and rectal impaction with colonic ileus.  Hypoperfusion of the left kidney.  This may be due to pyelonephritis.  There is a nonobstructing left renal calculus.   Original Report Authenticated By: Janeece Riggers, M.D.   Ct Abdomen Pelvis W Contrast  04/12/2013   *RADIOLOGY REPORT*  Clinical Data:  Lung nodules.  Anemia  CT CHEST, ABDOMEN AND PELVIS WITH CONTRAST  Technique:  Multidetector CT imaging of the chest, abdomen and pelvis was performed following the standard protocol during bolus administration of intravenous contrast.  Contrast: 80mL OMNIPAQUE IOHEXOL 300 MG/ML  SOLN  Comparison:  Chest x-ray from today.  CT abdomen pelvis 03/27/2010. CT chest 02/24/2007  CT CHEST  Findings:  Numerous lung nodules are present bilaterally.  Most of the lung nodules have low density central necrosis and many have cavitated with numerous gas filled nodules present.  Several these have air-fluid levels.  The cavitary  nodules have moderately thick  irregular walls.  Most of the nodules measure under 2 cm.  Index left lower lobe cavitary nodule measures 19 mm in diameter.  Solid nodule superior segment right lower lobe measures 17 mm.  No significant mediastinal or hilar adenopathy is identified.  Mild cardiac enlargement.  Small pleural effusions bilaterally. Mild atelectasis in the lung bases.  Negative for edema or pneumonia.  No bony destructive process.  No fracture in the thoracic spine.  14 mm mass distal esophagus with associated hiatal hernia.  IMPRESSION: Widespread pulmonary nodules.  Many these are necrotic and many are cavitary.  These could be due to infection such as septic emboli. Metastatic disease also is a consideration.  No evidence of adenopathy.  14 mm mass in the distal esophagus associated with a small hiatal hernia.  This could be a neoplasm of the esophagus versus a benign neoplasm or mucosal thickening related to hiatal hernia.  CT ABDOMEN AND PELVIS  Findings:  The patient is markedly constipated with a large amount of stool throughout dilated colon.  There is a rectal impaction. No evidence of bowel obstruction.  Colonic ileus is present.  Liver gallbladder and bile ducts are normal.  Pancreas and spleen are normal.  The left kidney shows decreased perfusion compared to the right kidney.  There is no hydronephrosis.  There is a 7 mm nonobstructing midpole stone.  Hypoperfusion of the left kidney may be due to pyelonephritis.  Alternately, there is atherosclerotic calcification in the left renal artery origin which could be a cause for hypoperfusion of the left kidney is normal in size and infection is favored.  The right kidney shows no obstruction or mass or stone.  Heavily calcified aorta and iliac arteries without aortic aneurysm. No adenopathy or free fluid.  No lumbar spine fracture or bony destructive lesion.  IMPRESSION: Mass lesion distal esophagus see above CT chest report  Severe constipation and rectal impaction with  colonic ileus.  Hypoperfusion of the left kidney.  This may be due to pyelonephritis.  There is a nonobstructing left renal calculus.   Original Report Authenticated By: Janeece Riggers, M.D.   Dg Chest Port 1 View  04/16/2013   *RADIOLOGY REPORT*  Clinical Data: Fever.  Question pneumonia.  PORTABLE CHEST - 1 VIEW  Comparison: 04/12/2013  Findings: 1129 hours. The cardiopericardial silhouette is enlarged. Interval increase in retrocardiac collapse / consolidation. Pulmonary vascular congestion noted with diffuse bilateral interstitial opacity, right greater than left.  The new interstitial and basilar airspace disease obscures many of the nodular densities seen on the previous study.  IMPRESSION: Worsening diffuse interstitial and bibasilar airspace disease. Imaging features could be related to edema or progressive infection.  The multiple nodular opacities seen on the previous study have become largely obscured by the more diffuse lung disease on today's exam.   Original Report Authenticated By: Kennith Center, M.D.   Dg Chest Port 1 View  04/12/2013   *RADIOLOGY REPORT*  Clinical Data: Altered mental status.  PORTABLE CHEST - 1 VIEW  Comparison: CT chest 02/24/2007 and single view of the chest 03/27/2010.  Findings: Multiple bilateral nodular opacities are identified.  One of the largest is in the left upper lobe and measures 1.4 cm in diameter.  No consolidative process, pneumothorax or effusion is seen.  No focal bony abnormality is identified.  IMPRESSION: Bilateral pulmonary nodules worrisome for metastatic disease.   Original Report Authenticated By: Holley Dexter, M.D.    Scheduled Meds: . antiseptic oral rinse  15 mL Mouth Rinse BID  . brimonidine  1 drop Both Eyes BID  . donepezil  10 mg Oral QHS  . enoxaparin (LOVENOX) injection  30 mg Subcutaneous Q24H  . feeding supplement  237 mL Oral BID BM  . losartan  12.5 mg Oral Daily  . piperacillin-tazobactam  3.375 g Intravenous Q8H  . sodium  chloride  3 mL Intravenous Q12H  . vancomycin  750 mg Intravenous Q24H   Continuous Infusions:   Principal Problem:   Sepsis with metabolic encephalopathy Active Problems:   Dementia   Hypertension   Hematuria   Anemia   UTI (lower urinary tract infection)   Protein-calorie malnutrition, severe    Time spent: 40 minutes   [facility]  [facility] Pager [phone]. If 8PM-8AM, please contact night-coverage at [email or web address], password [facility] 04/18/2013, 9:59 AM  LOS: 6 days

## 2013-04-18 NOTE — Consult Note (Signed)
Jean Shaw Jean Shaw      DOB: 10/19/1928      JWJ:[id number]     Consult Note from the Palliative Medicine Team at [facility]    Consult Requested by: Dr. Susie Shaw     PCP: Jean Jenny, MD Reason for Consultation: GOC     Phone [phone]  Assessment of patients Current state: Jean Shaw is an 79 year old African American female presented to the hospital with hematuria, altered mental status and fever. She had been being treated for a urinary tract infection which has been acute on chronic. During this workup the Jean Shaw has been found to have multiple pulmonary nodules possibly related to a carcinoma, and a mass at the distal esophagus. The Jean Shaw's Shaw recognizes that this may be cancer and may have chronically recurrent implications but she does not wish to have further testing. She currently has a network of support which includes Jean Shaw in his home care services, [facility] services for most of the day and spiritual services to come to the house. She stated that she does not really know what hospice will do for her further and is really not interested in full services at this time. The Jean Shaw also may not be able to maintain her CAPS services if hospice is involved. She has given me permission to check on this so that she will know for the future. For now the Jean Shaw's Shaw conveyed her desire to maintain her mother's care at home which is something their family has done for generations. She affirms her DO NOT RESUSCITATE status and maintains that return to the hospital was not her wish that if she has to do so she will. She is not interested in a feeding tube at this time and hand feeds her mother lovingly. She would prefer comfort feeding.   Goals of Care: 1.  Code Status: DNR/ DNI   2. Scope of Treatment: The Jean Shaw's Shaw desires to continue her chronic medications and complete her antibiotic course at this time.  She is not sure that she would not ask for  treatment with antibiotics in the future and asks to think about this.  She is very satisfied with Jean Shaw services and really does not desire to have hospice at this time . She understands that she can call the hospice of her choice at any time to consider enrollment.   4. Disposition: Home with her home health services.  I will check on compatibility with [facility] services.   3. Symptom Management:   1. Anxiety/Agitation: continue aricept 2. Pain: tylenol.  Jean Shaw has no pain at this time and no respiratory distress , although she is high risk for fever 3. Bowel Regimen: obstipated on admission , continue Miralax and consider suppository weekly  4. Psychosocial: Jean Shaw has lived with her Shaw for years and is cared for by her Shaw and her Jean Shaw   54. Spiritual: Jean Shaw states they have familial support from her church and the Jean Shaw was up the street.         Jean Shaw Documents Completed or Given: Document Given Completed  Advanced Directives Pkt    MOST    DNR    Gone from My Sight    Hard Choices      Brief HPI: Jean Shaw is an 79 year old African American female who has generally been cared for at home by her Shaw for the last number of years. She was brought to the emergency room for hematuria, fever and worsening mental status. The Jean Shaw had been  being treated with oral antibiotics at home for acute on chronic urinary tract infection we are asked to assist with goals of care.   ROS: Unable to be obtained    PMH:  Past Medical History  Diagnosis Date  . Glaucoma   . Hypertension   . Constipation   . Dementia   . Hearing loss in right ear   . Legally blind     "from glaucoma" (04/13/2013)  . Alzheimer's dementia   . History of blood transfusion     "got 2 units yesterday" (04/13/2013)  . Arthritis     'generalized" (04/13/2013)  . Recurrent UTI     "been going on since 2011 thru this admission" (04/13/2013)  . Hearing loss of both ears     "no hearing  out of right; won't wear aid in left" (04/13/2013)     PSH: Past Surgical History  Procedure Laterality Date  . Abdominal hysterectomy     I have reviewed the FH and SH and  If appropriate update it with new information. No Known Allergies Scheduled Meds: . antiseptic oral rinse  15 mL Mouth Rinse BID  . brimonidine  1 drop Both Eyes BID  . donepezil  10 mg Oral QHS  . enoxaparin (LOVENOX) injection  30 mg Subcutaneous Q24H  . feeding supplement  237 mL Oral BID BM  . losartan  12.5 mg Oral Daily  . piperacillin-tazobactam  3.375 g Intravenous Q8H  . sodium chloride  3 mL Intravenous Q12H  . vancomycin  750 mg Intravenous Q24H   Continuous Infusions:  PRN Meds:.acetaminophen, acetaminophen, hydrALAZINE, ondansetron (ZOFRAN) IV, ondansetron, polyethylene glycol    BP 166/75  Pulse 64  Temp(Src) 98.1 F (36.7 C) (Oral)  Resp 16  Ht 5\' 5"  (1.651 m)  Wt 44.7 kg (98 lb 8.7 oz)  BMI 16.4 kg/m2  SpO2 99%   PPS: 20%   Intake/Output Summary (Last 24 hours) at 04/18/13 0916 Last data filed at 04/18/13 0530  Gross per 24 hour  Intake    200 ml  Output   1575 ml  Net  -1375 ml   LBM: 04/17/2013                Physical Exam:  General: Somnolent but arousable. Jean Shaw is very hard of hearing and hears best in her left ear she does not have the ability to see but can open her eyes. HEENT:  Pupils are opacified, mucous membranes are dry Chest:   Chest is decreased with anterior clearness no rhonchi rales or wheezes are noted CVS: Regular rate and rhythm distant positive S1 and S2-1 nutritionist or S4 Abdomen: Scaphoid soft no grimace on palpation Ext: Contracted lower extremities and upper extremities. Jean Shaw is agitated when you hold her hand to touch her hands. Slight edema of the feet Neuro: Advanced stages of dementia among verbal and when she is verbal she is perseverating  Labs: CBC    Component Value Date/Time   WBC 6.0 04/17/2013 0509   RBC 3.23* 04/17/2013 0509    HGB 8.4* 04/17/2013 0509   HCT 25.0* 04/17/2013 0509   PLT 218 04/17/2013 0509   MCV 77.4* 04/17/2013 0509   MCH 26.0 04/17/2013 0509   MCHC 33.6 04/17/2013 0509   RDW 19.8* 04/17/2013 0509   LYMPHSABS 1.7 10/18/2010 1204   MONOABS 0.9 10/18/2010 1204   EOSABS 0.1 10/18/2010 1204   BASOSABS 0.0 10/18/2010 1204      CMP     Component Value Date/Time  NA 137 04/17/2013 0509   K 3.4* 04/17/2013 0509   CL 107 04/17/2013 0509   CO2 22 04/17/2013 0509   GLUCOSE 90 04/17/2013 0509   BUN 14 04/17/2013 0509   CREATININE 0.61 04/17/2013 0509   CALCIUM 8.7 04/17/2013 0509   PROT 5.5* 04/16/2013 1230   ALBUMIN 1.5* 04/16/2013 1230   AST 16 04/16/2013 1230   ALT 13 04/16/2013 1230   ALKPHOS 52 04/16/2013 1230   BILITOT 0.3 04/16/2013 1230   GFRNONAA 83* 04/17/2013 0509   GFRAA >90 04/17/2013 0509    Chest Xray Reviewed/Impressions: Worsening diffuse interstitial and bibasilar airspace disease.  Imaging features could be related to edema or progressive  infection.  The multiple nodular opacities seen on the previous study have  become largely obscured by the more diffuse lung disease on today's  exam.    CT scan of the Chest/Abd/Pelvis Reviewed/Impressions:  IIMPRESSION:  Widespread pulmonary nodules. Many these are necrotic and many are  cavitary. These could be due to infection such as septic emboli.  Metastatic disease also is a consideration. No evidence of  adenopathy.  14 mm mass in the distal esophagus associated with a small hiatal  hernia. This could be a neoplasm of the esophagus versus a benign  neoplasm or mucosal thickening related to hiatal hernia.  CT ABDOMEN AND PELVIS  Findings: The Jean Shaw is markedly constipated with a large amount  of stool throughout dilated colon. There is a rectal impaction.  No evidence of bowel obstruction. Colonic ileus is present.  Liver gallbladder and bile ducts are normal. Pancreas and spleen  are normal.  The left kidney shows decreased perfusion compared to the right   kidney. There is no hydronephrosis. There is a 7 mm  nonobstructing midpole stone. Hypoperfusion of the left kidney may  be due to pyelonephritis. Alternately, there is atherosclerotic  calcification in the left renal artery origin which could be a  cause for hypoperfusion of the left kidney is normal in size and  infection is favored. The right kidney shows no obstruction or  mass or stone.  Heavily calcified aorta and iliac arteries without aortic aneurysm.  No adenopathy or free fluid. No lumbar spine fracture or bony  destructive lesion.  IMPRESSION:  Mass lesion distal esophagus see above CT chest report  Severe constipation and rectal impaction with colonic ileus.  Hypoperfusion of the left kidney. This may be due to  pyelonephritis. There is a nonobstructing left renal calculus     Time In Time Out Total Time Spent with Jean Shaw Total Overall Time  800 am 830 am 22 min  30 min    Greater than 50%  of this time was spent counseling and coordinating care related to the above assessment and plan.  Dioselina Brumbaugh L. Ladona Ridgel, MD MBA The Palliative Medicine Team at [facility] Phone: [phone] Pager: [phone]

## 2013-04-19 MED ORDER — AMOXICILLIN-POT CLAVULANATE 875-125 MG PO TABS: 1.0000 | ORAL_TABLET | Freq: Two times a day (BID) | ORAL | Status: AC

## 2013-04-19 NOTE — Progress Notes (Addendum)
Nutrition Brief Note  Chart reviewed. Patient's nutrition care plan is comfort feeds; no tube feeding desired. No further nutrition interventions warranted at this time.  Please re-consult as needed.   Maureen Chatters, RD, LDN Pager #: [phone] After-Hours Pager #: [phone]

## 2013-04-19 NOTE — Evaluation (Signed)
Clinical/Bedside Swallow Evaluation Patient Details  Name: Jean Shaw MRN: [id number] Date of Birth: [birth year]  Today's Date: 04/19/2013 Time: 1050-1105 SLP Time Calculation (min): 15 min  Past Medical History:  Past Medical History  Diagnosis Date  . Glaucoma   . Hypertension   . Constipation   . Dementia   . Hearing loss in right ear   . Legally blind     "from glaucoma" (04/13/2013)  . Alzheimer's dementia   . History of blood transfusion     "got 2 units yesterday" (04/13/2013)  . Arthritis     'generalized" (04/13/2013)  . Recurrent UTI     "been going on since 2011 thru this admission" (04/13/2013)  . Hearing loss of both ears     "no hearing out of right; won't wear aid in left" (04/13/2013)   Past Surgical History:  Past Surgical History  Procedure Laterality Date  . Abdominal hysterectomy     HPI:  79 y/o female with dementia, HTN, renals tones, chronic UTI on abx, brought in by daughter as she appeared febrile this morning and had poor responsiveness associated with some chills. Temp at home was 41F..she also noticed blood in her diapers this morning.  Patient was at her baseline until this morning. At baseline she has moderate to severe dementia , is able to carry out conversations mostly at yes or no, fully bedbound and dependent with all her ADLs, awake and alert , has hearing impairment and legally blind.     Assessment / Plan / Recommendation Clinical Impression  Pt seen with daughter at bedside. Daughter confirmed that her wish is for the pt to consume PO even if it meant that she was aspirating. SLP and daughter discussed feeding methods at length but no POs were provided as pt was sleeping. SLP educated dtr on nature of aspiration and pna resulting from aspiration. Instructed daughter in positioning, oral care and esophageal precautions to reduce risk and impact of probable aspiration events. Daughter verbalized understanding. No f/u treatment needed at this  time. Edcuation complete; continue current diet consistent with pt/family wishes.     Aspiration Risk  Moderate    Diet Recommendation Dysphagia 1 (Puree);Thin liquid   Liquid Administration via: Spoon Medication Administration: Crushed with puree Supervision: Trained caregiver to feed patient Compensations: Small sips/bites;Slow rate;Check for pocketing Postural Changes and/or Swallow Maneuvers: Seated upright 90 degrees;Upright 30-60 min after meal    Other  Recommendations Oral Care Recommendations: Oral care QID   Follow Up Recommendations  None    Frequency and Duration        Pertinent Vitals/Pain NA    SLP Swallow Goals     Swallow Study Prior Functional Status       General HPI: 79 y/o female with dementia, HTN, renals tones, chronic UTI on abx, brought in by daughter as she appeared febrile this morning and had poor responsiveness associated with some chills. Temp at home was 41F..she also noticed blood in her diapers this morning.  Patient was at her baseline until this morning. At baseline she has moderate to severe dementia , is able to carry out conversations mostly at yes or no, fully bedbound and dependent with all her ADLs, awake and alert , has hearing impairment and legally blind.   Type of Study: Bedside swallow evaluation Previous Swallow Assessment: MBS Diet Prior to this Study: Dysphagia 1 (puree);Thin liquids Temperature Spikes Noted: Yes Respiratory Status: Room air History of Recent Intubation: No Behavior/Cognition: Lethargic Oral Cavity - Dentition:  Edentulous Self-Feeding Abilities: Total assist Patient Positioning: Other (comment) (daughter sits pt edge of bed for meals)    Oral/Motor/Sensory Function Overall Oral Motor/Sensory Function: Other (comment) (not assessed)   Ice Chips Ice chips: Not tested   Thin Liquid Thin Liquid: Not tested    Nectar Thick Nectar Thick Liquid: Not tested   Honey Thick Honey Thick Liquid: Not tested   Puree  Puree: Not tested   Solid   GO    Solid: Not tested      Harlon Ditty, MA CCC-SLP [phone]  Claudine Mouton 04/19/2013,1:28 PM

## 2013-04-19 NOTE — Discharge Summary (Signed)
Physician Discharge Summary  Hannie Shoe MRN: [id number] DOB/AGE: 77/28/1930 77 y.o.  PCP: Florentina Jenny, MD   Admit date: 04/12/2013 Discharge date: 04/19/2013  Discharge Diagnoses:   Suspected aspiration pneumonia   Sepsis with metabolic encephalopathy Active Problems:   Dementia   Hypertension   Hematuria   Anemia   UTI (lower urinary tract infection)   Protein-calorie malnutrition, severe  Followup Patient will discharge home with home health services PCP in 1 to repeat a chest x-ray in one week after completion of Augmentin Hospice services were offered but not accepted by the family     Medication List    STOP taking these medications       nitrofurantoin 50 MG capsule  Commonly known as:  MACRODANTIN      TAKE these medications       amoxicillin-clavulanate 875-125 MG per tablet  Commonly known as:  AUGMENTIN  Take 1 tablet by mouth 2 (two) times daily.     aspirin EC 81 MG tablet  Take 81 mg by mouth daily.     brimonidine 0.15 % ophthalmic solution  Commonly known as:  ALPHAGAN  Place 1 drop into both eyes 2 (two) times daily.     docusate sodium 50 MG capsule  Commonly known as:  COLACE  Take 100 mg by mouth 2 (two) times daily.     donepezil 10 MG tablet  Commonly known as:  ARICEPT  Take 10 mg by mouth at bedtime.     losartan 25 MG tablet  Commonly known as:  COZAAR  Take 12.5 mg by mouth daily.     polyethylene glycol packet  Commonly known as:  MIRALAX / GLYCOLAX  Take 17 g by mouth daily as needed (for constipation).        Discharge Condition: Guarded   Disposition: 01-Home or Self Care   Consults: Palliative care  Significant Diagnostic Studies: Ct Chest W Contrast  04/12/2013   *RADIOLOGY REPORT*  Clinical Data:  Lung nodules.  Anemia  CT CHEST, ABDOMEN AND PELVIS WITH CONTRAST  Technique:  Multidetector CT imaging of the chest, abdomen and pelvis was performed following the standard protocol during bolus  administration of intravenous contrast.  Contrast: 80mL OMNIPAQUE IOHEXOL 300 MG/ML  SOLN  Comparison:  Chest x-ray from today.  CT abdomen pelvis 03/27/2010. CT chest 02/24/2007  CT CHEST  Findings:  Numerous lung nodules are present bilaterally.  Most of the lung nodules have low density central necrosis and many have cavitated with numerous gas filled nodules present.  Several these have air-fluid levels.  The cavitary  nodules have moderately thick irregular walls.  Most of the nodules measure under 2 cm.  Index left lower lobe cavitary nodule measures 19 mm in diameter.  Solid nodule superior segment right lower lobe measures 17 mm.  No significant mediastinal or hilar adenopathy is identified.  Mild cardiac enlargement.  Small pleural effusions bilaterally. Mild atelectasis in the lung bases.  Negative for edema or pneumonia.  No bony destructive process.  No fracture in the thoracic spine.  14 mm mass distal esophagus with associated hiatal hernia.  IMPRESSION: Widespread pulmonary nodules.  Many these are necrotic and many are cavitary.  These could be due to infection such as septic emboli. Metastatic disease also is a consideration.  No evidence of adenopathy.  14 mm mass in the distal esophagus associated with a small hiatal hernia.  This could be a neoplasm of the esophagus versus a benign neoplasm or  mucosal thickening related to hiatal hernia.  CT ABDOMEN AND PELVIS  Findings:  The patient is markedly constipated with a large amount of stool throughout dilated colon.  There is a rectal impaction. No evidence of bowel obstruction.  Colonic ileus is present.  Liver gallbladder and bile ducts are normal.  Pancreas and spleen are normal.  The left kidney shows decreased perfusion compared to the right kidney.  There is no hydronephrosis.  There is a 7 mm nonobstructing midpole stone.  Hypoperfusion of the left kidney may be due to pyelonephritis.  Alternately, there is atherosclerotic calcification in the  left renal artery origin which could be a cause for hypoperfusion of the left kidney is normal in size and infection is favored.  The right kidney shows no obstruction or mass or stone.  Heavily calcified aorta and iliac arteries without aortic aneurysm. No adenopathy or free fluid.  No lumbar spine fracture or bony destructive lesion.  IMPRESSION: Mass lesion distal esophagus see above CT chest report  Severe constipation and rectal impaction with colonic ileus.  Hypoperfusion of the left kidney.  This may be due to pyelonephritis.  There is a nonobstructing left renal calculus.   Original Report Authenticated By: Janeece Riggers, M.D.   Ct Abdomen Pelvis W Contrast  04/12/2013   *RADIOLOGY REPORT*  Clinical Data:  Lung nodules.  Anemia  CT CHEST, ABDOMEN AND PELVIS WITH CONTRAST  Technique:  Multidetector CT imaging of the chest, abdomen and pelvis was performed following the standard protocol during bolus administration of intravenous contrast.  Contrast: 80mL OMNIPAQUE IOHEXOL 300 MG/ML  SOLN  Comparison:  Chest x-ray from today.  CT abdomen pelvis 03/27/2010. CT chest 02/24/2007  CT CHEST  Findings:  Numerous lung nodules are present bilaterally.  Most of the lung nodules have low density central necrosis and many have cavitated with numerous gas filled nodules present.  Several these have air-fluid levels.  The cavitary  nodules have moderately thick irregular walls.  Most of the nodules measure under 2 cm.  Index left lower lobe cavitary nodule measures 19 mm in diameter.  Solid nodule superior segment right lower lobe measures 17 mm.  No significant mediastinal or hilar adenopathy is identified.  Mild cardiac enlargement.  Small pleural effusions bilaterally. Mild atelectasis in the lung bases.  Negative for edema or pneumonia.  No bony destructive process.  No fracture in the thoracic spine.  14 mm mass distal esophagus with associated hiatal hernia.  IMPRESSION: Widespread pulmonary nodules.  Many these are  necrotic and many are cavitary.  These could be due to infection such as septic emboli. Metastatic disease also is a consideration.  No evidence of adenopathy.  14 mm mass in the distal esophagus associated with a small hiatal hernia.  This could be a neoplasm of the esophagus versus a benign neoplasm or mucosal thickening related to hiatal hernia.  CT ABDOMEN AND PELVIS  Findings:  The patient is markedly constipated with a large amount of stool throughout dilated colon.  There is a rectal impaction. No evidence of bowel obstruction.  Colonic ileus is present.  Liver gallbladder and bile ducts are normal.  Pancreas and spleen are normal.  The left kidney shows decreased perfusion compared to the right kidney.  There is no hydronephrosis.  There is a 7 mm nonobstructing midpole stone.  Hypoperfusion of the left kidney may be due to pyelonephritis.  Alternately, there is atherosclerotic calcification in the left renal artery origin which could be a cause for  hypoperfusion of the left kidney is normal in size and infection is favored.  The right kidney shows no obstruction or mass or stone.  Heavily calcified aorta and iliac arteries without aortic aneurysm. No adenopathy or free fluid.  No lumbar spine fracture or bony destructive lesion.  IMPRESSION: Mass lesion distal esophagus see above CT chest report  Severe constipation and rectal impaction with colonic ileus.  Hypoperfusion of the left kidney.  This may be due to pyelonephritis.  There is a nonobstructing left renal calculus.   Original Report Authenticated By: Janeece Riggers, M.D.   Dg Chest Port 1 View  04/16/2013   *RADIOLOGY REPORT*  Clinical Data: Fever.  Question pneumonia.  PORTABLE CHEST - 1 VIEW  Comparison: 04/12/2013  Findings: 1129 hours. The cardiopericardial silhouette is enlarged. Interval increase in retrocardiac collapse / consolidation. Pulmonary vascular congestion noted with diffuse bilateral interstitial opacity, right greater than left.   The new interstitial and basilar airspace disease obscures many of the nodular densities seen on the previous study.  IMPRESSION: Worsening diffuse interstitial and bibasilar airspace disease. Imaging features could be related to edema or progressive infection.  The multiple nodular opacities seen on the previous study have become largely obscured by the more diffuse lung disease on today's exam.   Original Report Authenticated By: Kennith Center, M.D.   Dg Chest Port 1 View  04/12/2013   *RADIOLOGY REPORT*  Clinical Data: Altered mental status.  PORTABLE CHEST - 1 VIEW  Comparison: CT chest 02/24/2007 and single view of the chest 03/27/2010.  Findings: Multiple bilateral nodular opacities are identified.  One of the largest is in the left upper lobe and measures 1.4 cm in diameter.  No consolidative process, pneumothorax or effusion is seen.  No focal bony abnormality is identified.  IMPRESSION: Bilateral pulmonary nodules worrisome for metastatic disease.   Original Report Authenticated By: Holley Dexter, M.D.      Microbiology: Recent Results (from the past 240 hour(s))  URINE CULTURE     Status: None   Collection Time    04/12/13  5:54 AM      Result Value Range Status   Specimen Description URINE, CLEAN CATCH   Final   Special Requests CX ADDED AT 0627 ON 213086   Final   Culture  Setup Time     Final   Value: 04/12/2013 13:39     Performed at [facility]   Colony Count     Final   Value: >=100,000 COLONIES/ML     Performed at [facility]   Culture     Final   Value: ESCHERICHIA COLI     Performed at [facility]   Report Status 04/13/2013 FINAL   Final   Organism ID, Bacteria ESCHERICHIA COLI   Final  MRSA PCR SCREENING     Status: None   Collection Time    04/12/13  9:03 AM      Result Value Range Status   MRSA by PCR NEGATIVE  NEGATIVE Final   Comment:            The GeneXpert MRSA Assay (FDA     approved for NASAL specimens     only), is one  component of a     comprehensive MRSA colonization     surveillance program. It is not     intended to diagnose MRSA     infection nor to guide or     monitor treatment for     MRSA infections.  CULTURE, BLOOD (ROUTINE X 2)     Status: None   Collection Time    04/12/13  5:09 PM      Result Value Range Status   Specimen Description BLOOD LEFT HAND   Final   Special Requests BOTTLES DRAWN AEROBIC AND ANAEROBIC 10CC   Final   Culture  Setup Time     Final   Value: 04/12/2013 22:53     Performed at [facility]   Culture     Final   Value: NO GROWTH 5 DAYS     Performed at [facility]   Report Status 04/18/2013 FINAL   Final  CULTURE, BLOOD (ROUTINE X 2)     Status: None   Collection Time    04/12/13  5:17 PM      Result Value Range Status   Specimen Description BLOOD RIGHT HAND   Final   Special Requests BOTTLES DRAWN AEROBIC AND ANAEROBIC 10CC   Final   Culture  Setup Time     Final   Value: 04/12/2013 22:53     Performed at [facility]   Culture     Final   Value: NO GROWTH 5 DAYS     Performed at [facility]   Report Status 04/18/2013 FINAL   Final     Labs: No results found for this or any previous visit (from the past 48 hour(s)).   HPI :79 y/o female with dementia, HTN, renals tones, chronic UTI on abx, brought in by daughter as she appeared febrile this morning and had poor responsiveness associated with some chills. Temp at home was 4F..she also noticed blood in her diapers this morning. Patient was at her baseline until this morning. At baseline she has moderate to severe dementia , is able to carry out conversations mostly at yes or no, fully bedbound and dependent with all her ADLs, awake and alert , has hearing impairment and legally blind.  Daughter called EMS and was brought to the hospital. She had no nausea, vomiting, diarrhea, shortness of breath , c/o pain anywhere, recent fall , poor appetite or worsening mental status until  his morning.    HOSPITAL COURSE:  Sepsis with metabolic encephalopathy  Persistent fever despite antibiotics  Chronic aspiration, UTI, pulmonary nodules, underlying malignancy?  Continues to have intermittent fever, suspect aspiration  IV hydration with NS. Discontinued because of labored breathing although patient is on room air  neurochecks unchanged  Initially started on  IV vanco and zosyn given CT findings outlined below. urine cx shows E COLI , blood cultures negative thus far  Speech therapy unable to assess due to lethargy  Discussed with the daughter that the iv antibiotics will be discontinued after total of 7 days , last dose today  May need 7 more days of PO augmentin and may benefit from repeat chest x -ray if family desires     Lung nodules  seen on CXR concerning for mets. Daughter reports hx of smoking in past. CT chest showed multiple bilateral lung nodules suggestive of mets vs ? Septic emboli. Also showed distal esophageal mass. Discussed with daughter . Does not want any intervention. Given her advanced dementia and poor functional status , she is likely a poor candidate for any treatment.  CT abd and pelvis shows some colonic ileus , resolved  Repeat chest x-ray worsening infiltrates,  Does not want hospice services at this time  Wants to go home with home health services Is not  sure about future treatments, antibiotics, hospitalizations,  Dementia  Severe. bed bound with blindness and hearing impairment. Recognizes daughter and her grandson but not abel to carry out full conversations. Fully dependent with ADLs   UTI (lower urinary tract infection)  Has hx of chronic UTI and is on nitrofurantoin at home which has been discontinued, we be resumed after completion of Augmentin . CT suggests pyelonephritis of left kidney . Also  Monitor I/O  Severe constipation  has rectal impaction with colonic ileus, now resolved   Hypertension  Resume antihypertensive  medications   Hematuria  Has hx of kidney stones . Has a nonobstructive. Left renal calculus. Monitor H&H.    Anemia  given 1 u PRBC in ED. hemoglobin 8.4    Code Status: DNR      Discharge Exam:   Blood pressure 152/53, pulse 58, temperature 97.8 F (36.6 C), temperature source Axillary, resp. rate 16, height 5\' 5"  (1.651 m), weight 44.7 kg (98 lb 8.7 oz), SpO2 100.00%.  General: Somnolent but arousable. Patient is very hard of hearing and hears best in her left ear she does not have the ability to see but can open her eyes.  HEENT: Pupils are opacified, mucous membranes are dry  Chest: Chest is decreased with anterior clearness no rhonchi rales or wheezes are noted  CVS: Regular rate and rhythm distant positive S1 and S2-1 nutritionist or S4  Abdomen: Scaphoid soft no grimace on palpation  Ext: Contracted lower extremities and upper extremities. Patient is agitated when you hold her hand to touch her hands. Slight edema of the feet  Neuro: Advanced stages of dementia among verbal and when she is verbal she is perseverating        Signed: Richarda Overlie 04/19/2013, 12:18 PM

## 2013-11-11 ENCOUNTER — Inpatient Hospital Stay (HOSPITAL_COMMUNITY)
Admission: EM | Admit: 2013-11-11 | Discharge: 2013-11-14 | DRG: 689 | Disposition: A | Discharge status: Home / Self Care | Payer: Medicare Other | Attending: Internal Medicine | Admitting: Internal Medicine

## 2013-11-11 ENCOUNTER — Encounter (HOSPITAL_COMMUNITY): Payer: Self-pay | Admitting: Emergency Medicine

## 2013-11-11 DIAGNOSIS — R6520 Severe sepsis without septic shock: Secondary | ICD-10-CM

## 2013-11-11 DIAGNOSIS — F0390 Unspecified dementia without behavioral disturbance: Secondary | ICD-10-CM

## 2013-11-11 DIAGNOSIS — R319 Hematuria, unspecified: Secondary | ICD-10-CM

## 2013-11-11 DIAGNOSIS — H409 Unspecified glaucoma: Secondary | ICD-10-CM | POA: Diagnosis present

## 2013-11-11 DIAGNOSIS — F0280 Dementia in other diseases classified elsewhere without behavioral disturbance: Secondary | ICD-10-CM | POA: Diagnosis present

## 2013-11-11 DIAGNOSIS — D509 Iron deficiency anemia, unspecified: Secondary | ICD-10-CM | POA: Diagnosis present

## 2013-11-11 DIAGNOSIS — H9190 Unspecified hearing loss, unspecified ear: Secondary | ICD-10-CM | POA: Diagnosis present

## 2013-11-11 DIAGNOSIS — H548 Legal blindness, as defined in USA: Secondary | ICD-10-CM | POA: Diagnosis present

## 2013-11-11 DIAGNOSIS — G309 Alzheimer's disease, unspecified: Secondary | ICD-10-CM | POA: Diagnosis present

## 2013-11-11 DIAGNOSIS — D649 Anemia, unspecified: Secondary | ICD-10-CM

## 2013-11-11 DIAGNOSIS — A419 Sepsis, unspecified organism: Secondary | ICD-10-CM

## 2013-11-11 DIAGNOSIS — Z79899 Other long term (current) drug therapy: Secondary | ICD-10-CM

## 2013-11-11 DIAGNOSIS — Z7982 Long term (current) use of aspirin: Secondary | ICD-10-CM

## 2013-11-11 DIAGNOSIS — I10 Essential (primary) hypertension: Secondary | ICD-10-CM | POA: Diagnosis present

## 2013-11-11 DIAGNOSIS — G9341 Metabolic encephalopathy: Secondary | ICD-10-CM

## 2013-11-11 DIAGNOSIS — E43 Unspecified severe protein-calorie malnutrition: Secondary | ICD-10-CM | POA: Diagnosis present

## 2013-11-11 DIAGNOSIS — Z66 Do not resuscitate: Secondary | ICD-10-CM | POA: Diagnosis present

## 2013-11-11 DIAGNOSIS — Z87891 Personal history of nicotine dependence: Secondary | ICD-10-CM

## 2013-11-11 DIAGNOSIS — N390 Urinary tract infection, site not specified: Principal | ICD-10-CM | POA: Diagnosis present

## 2013-11-11 HISTORY — DX: Calculus of kidney: N20.0

## 2013-11-11 LAB — CBC WITH DIFFERENTIAL/PLATELET
BASOS ABS: 0 10*3/uL (ref 0.0–0.1)
Basophils Relative: 0 % (ref 0–1)
EOS ABS: 0.1 10*3/uL (ref 0.0–0.7)
Eosinophils Relative: 1 % (ref 0–5)
HCT: 21.3 % — ABNORMAL LOW (ref 36.0–46.0)
Hemoglobin: 6.5 g/dL — CL (ref 12.0–15.0)
LYMPHS PCT: 26 % (ref 12–46)
Lymphs Abs: 1.6 10*3/uL (ref 0.7–4.0)
MCH: 20.4 pg — AB (ref 26.0–34.0)
MCHC: 30.5 g/dL (ref 30.0–36.0)
MCV: 67 fL — ABNORMAL LOW (ref 78.0–100.0)
MONO ABS: 1.1 10*3/uL — AB (ref 0.1–1.0)
Monocytes Relative: 17 % — ABNORMAL HIGH (ref 3–12)
NEUTROS PCT: 56 % (ref 43–77)
Neutro Abs: 3.5 10*3/uL (ref 1.7–7.7)
Platelets: 428 10*3/uL — ABNORMAL HIGH (ref 150–400)
RBC: 3.18 MIL/uL — ABNORMAL LOW (ref 3.87–5.11)
RDW: 18.2 % — AB (ref 11.5–15.5)
WBC: 6.3 10*3/uL (ref 4.0–10.5)

## 2013-11-11 LAB — URINALYSIS, ROUTINE W REFLEX MICROSCOPIC
GLUCOSE, UA: NEGATIVE mg/dL
KETONES UR: 15 mg/dL — AB
Nitrite: POSITIVE — AB
Specific Gravity, Urine: 1.024 (ref 1.005–1.030)
UROBILINOGEN UA: 0.2 mg/dL (ref 0.0–1.0)
pH: 7.5 (ref 5.0–8.0)

## 2013-11-11 LAB — BASIC METABOLIC PANEL
BUN: 12 mg/dL (ref 6–23)
CALCIUM: 9.6 mg/dL (ref 8.4–10.5)
CO2: 22 meq/L (ref 19–32)
CREATININE: 0.62 mg/dL (ref 0.50–1.10)
Chloride: 98 mEq/L (ref 96–112)
GFR calc Af Amer: >90 mL/min (ref >90)
GFR, EST NON AFRICAN AMERICAN: 82 mL/min — AB (ref >90)
GLUCOSE: 93 mg/dL (ref 70–99)
Potassium: 4.2 mEq/L (ref 3.7–5.3)
SODIUM: 133 meq/L — AB (ref 137–147)

## 2013-11-11 LAB — URINE MICROSCOPIC-ADD ON

## 2013-11-11 LAB — RETICULOCYTES
RBC.: 2.91 MIL/uL — ABNORMAL LOW (ref 3.87–5.11)
RETIC CT PCT: 1.7 % (ref 0.4–3.1)
Retic Count, Absolute: 49.5 10*3/uL (ref 19.0–186.0)

## 2013-11-11 LAB — FERRITIN: Ferritin: 10 ng/mL (ref 10–291)

## 2013-11-11 LAB — IRON AND TIBC
IRON: 15 ug/dL — AB (ref 42–135)
SATURATION RATIOS: 5 % — AB (ref 20–55)
TIBC: 328 ug/dL (ref 250–470)
UIBC: 313 ug/dL (ref 125–400)

## 2013-11-11 LAB — VITAMIN B12

## 2013-11-11 LAB — POC OCCULT BLOOD, ED: FECAL OCCULT BLD: NEGATIVE

## 2013-11-11 LAB — PREPARE RBC (CROSSMATCH)

## 2013-11-11 LAB — FOLATE

## 2013-11-11 MED ORDER — SODIUM CHLORIDE 0.9 % IV SOLN
INTRAVENOUS | Status: AC
  Administered 2013-11-11 – 2013-11-13 (×3): via INTRAVENOUS

## 2013-11-11 MED ORDER — DEXTROSE 5 % IV SOLN
1.0000 g | INTRAVENOUS | Status: DC
  Administered 2013-11-12 – 2013-11-13 (×2): 1 g via INTRAVENOUS
  Filled 2013-11-11 (×4): qty 10

## 2013-11-11 MED ORDER — ACETAMINOPHEN 325 MG PO TABS
650.0000 mg | ORAL_TABLET | Freq: Four times a day (QID) | ORAL | Status: DC | PRN
  Administered 2013-11-11: 650 mg via ORAL
  Filled 2013-11-11: qty 2

## 2013-11-11 MED ORDER — HYDRALAZINE HCL 20 MG/ML IJ SOLN
5.0000 mg | Freq: Four times a day (QID) | INTRAMUSCULAR | Status: DC | PRN
  Administered 2013-11-11: 5 mg via INTRAVENOUS
  Filled 2013-11-11: qty 1

## 2013-11-11 MED ORDER — POLYETHYLENE GLYCOL 3350 17 G PO PACK
17.0000 g | PACK | Freq: Every morning | ORAL | Status: DC
  Administered 2013-11-12 – 2013-11-13 (×2): 17 g via ORAL
  Filled 2013-11-11 (×3): qty 1

## 2013-11-11 MED ORDER — ONDANSETRON HCL 4 MG PO TABS: 4.0000 mg | ORAL_TABLET | Freq: Four times a day (QID) | ORAL | Status: DC | PRN

## 2013-11-11 MED ORDER — ACETAMINOPHEN 650 MG RE SUPP: 650.0000 mg | Freq: Four times a day (QID) | RECTAL | Status: DC | PRN

## 2013-11-11 MED ORDER — DOCUSATE SODIUM 100 MG PO CAPS
100.0000 mg | ORAL_CAPSULE | Freq: Two times a day (BID) | ORAL | Status: DC
  Administered 2013-11-11 – 2013-11-13 (×3): 100 mg via ORAL
  Filled 2013-11-11 (×5): qty 1

## 2013-11-11 MED ORDER — DONEPEZIL HCL 10 MG PO TABS
10.0000 mg | ORAL_TABLET | Freq: Every day | ORAL | Status: DC
  Administered 2013-11-11 – 2013-11-13 (×3): 10 mg via ORAL
  Filled 2013-11-11 (×4): qty 1

## 2013-11-11 MED ORDER — ASPIRIN EC 81 MG PO TBEC
81.0000 mg | DELAYED_RELEASE_TABLET | Freq: Every day | ORAL | Status: DC
  Administered 2013-11-12 – 2013-11-14 (×3): 81 mg via ORAL
  Filled 2013-11-11 (×3): qty 1

## 2013-11-11 MED ORDER — POLYETHYLENE GLYCOL 3350 17 G PO PACK: 17.0000 g | PACK | Freq: Every day | ORAL | Status: DC | PRN

## 2013-11-11 MED ORDER — BRIMONIDINE TARTRATE 0.2 % OP SOLN
1.0000 [drp] | Freq: Two times a day (BID) | OPHTHALMIC | Status: DC
  Administered 2013-11-11 – 2013-11-14 (×6): 1 [drp] via OPHTHALMIC
  Filled 2013-11-11 (×3): qty 5

## 2013-11-11 MED ORDER — LOSARTAN POTASSIUM 25 MG PO TABS
12.5000 mg | ORAL_TABLET | Freq: Every morning | ORAL | Status: DC
  Administered 2013-11-12 – 2013-11-14 (×3): 12.5 mg via ORAL
  Filled 2013-11-11 (×3): qty 0.5

## 2013-11-11 MED ORDER — ONDANSETRON HCL 4 MG/2ML IJ SOLN: 4.0000 mg | Freq: Four times a day (QID) | INTRAMUSCULAR | Status: DC | PRN

## 2013-11-11 MED ORDER — HEPARIN SODIUM (PORCINE) 5000 UNIT/ML IJ SOLN
5000.0000 [IU] | Freq: Three times a day (TID) | INTRAMUSCULAR | Status: DC
  Administered 2013-11-11 – 2013-11-14 (×7): 5000 [IU] via SUBCUTANEOUS
  Filled 2013-11-11 (×10): qty 1

## 2013-11-11 MED ORDER — DEXTROSE 5 % IV SOLN
1.0000 g | Freq: Once | INTRAVENOUS | Status: AC
  Administered 2013-11-11: 1 g via INTRAVENOUS
  Filled 2013-11-11: qty 10

## 2013-11-11 NOTE — ED Notes (Signed)
Blood started at 1722

## 2013-11-11 NOTE — ED Provider Notes (Signed)
CSN: [id number]     Arrival date & time 11/11/13  1342 History   First MD Initiated Contact with Patient 11/11/13 1343     Chief Complaint  Patient presents with  . Hematuria  . Fever     (Consider location/radiation/quality/duration/timing/severity/associated sxs/prior Treatment) HPI  This is an 80 year old female with a history of glaucoma, hypertension, Alzheimer's dementia who presents with anemia. Per the patient's daughter, patient has been treated for a UTI. She had repeat lab work taken yesterday that showed a hemoglobin of 6.4. She was referred here for further management. No noted bloody stools. Patient does get significant hematuria with her urinary tract infections. No noted fevers at home. She is DNR/DNI and has a portable DO NOT RESUSCITATE with her.  Patient is noncontributory to history taking.  Level V caveat for dementia  Past Medical History  Diagnosis Date  . Glaucoma   . Hypertension   . Constipation   . Dementia   . Hearing loss in right ear   . Legally blind     "from glaucoma" (04/13/2013)  . Alzheimer's dementia   . History of blood transfusion     "got 2 units yesterday" (04/13/2013)  . Arthritis     'generalized" (04/13/2013)  . Recurrent UTI     "been going on since 2011 thru this admission" (04/13/2013)  . Hearing loss of both ears     "no hearing out of right; won't wear aid in left" (04/13/2013)   Past Surgical History  Procedure Laterality Date  . Abdominal hysterectomy     No family history on file. History  Substance Use Topics  . Smoking status: Former Smoker -- 50 years    Types: Cigarettes  . Smokeless tobacco: Never Used     Comment: 04/13/2013 "stopped smoking > 10 yr ago"  . Alcohol Use: Yes     Comment: 04/13/2013 "used to drink; never had a problem w/it"   OB History   Grav Para Term Preterm Abortions TAB SAB Ect Mult Living                 Review of Systems  Unable to perform ROS: Dementia      Allergies  Review of patient's  allergies indicates no known allergies.  Home Medications   Current Outpatient Rx  Name  Route  Sig  Dispense  Refill  . aspirin EC 81 MG tablet   Oral   Take 81 mg by mouth daily.         . brimonidine (ALPHAGAN) 0.15 % ophthalmic solution   Both Eyes   Place 1 drop into both eyes 2 (two) times daily.         Marland Kitchen CRANBERRY PO   Oral   Take 600 mg by mouth 2 (two) times daily.         Marland Kitchen docusate sodium (COLACE) 50 MG capsule   Oral   Take 100 mg by mouth 2 (two) times daily.         Marland Kitchen donepezil (ARICEPT) 10 MG tablet   Oral   Take 10 mg by mouth at bedtime.         . ENSURE (ENSURE)   Oral   Take 237 mLs by mouth See admin instructions. Takes 1 can two to three times daily, depending on appetite.         Marland Kitchen losartan (COZAAR) 25 MG tablet   Oral   Take 12.5 mg by mouth every morning.          Marland Kitchen  polyethylene glycol (MIRALAX / GLYCOLAX) packet   Oral   Take 17 g by mouth every morning.           BP 182/65  Pulse 65  Temp(Src) 98.3 F (36.8 C) (Rectal)  Resp 15  SpO2 99% Physical Exam  Nursing note and vitals reviewed. Constitutional: No distress.  Chronically ill-appearing, contractured  HENT:  Head: Normocephalic and atraumatic.  Mucous membranes dry  Eyes:  Opacities in the bilateral lenses  Neck: Neck supple.  Cardiovascular: Normal rate, regular rhythm and normal heart sounds.   Pulmonary/Chest: Effort normal and breath sounds normal. No respiratory distress. She has no wheezes.  Abdominal: Soft. Bowel sounds are normal. There is no tenderness. There is no rebound and no guarding.  Vertical midline scar  Genitourinary: Guaiac negative stool.  Normal rectal tone, no gross blood  Musculoskeletal: She exhibits no edema.  Bilateral upper and lower extremity contractures  Neurological: She is alert.  Nonverbal, does not follow commands  Skin: Skin is warm and dry. No rash noted.  Psychiatric: She has a normal mood and affect.    ED Course   Procedures (including critical care time) Labs Review Labs Reviewed  CBC WITH DIFFERENTIAL - Abnormal; Notable for the following:    RBC 3.18 (*)    Hemoglobin 6.5 (*)    HCT 21.3 (*)    MCV 67.0 (*)    MCH 20.4 (*)    RDW 18.2 (*)    Platelets 428 (*)    Monocytes Relative 17 (*)    Monocytes Absolute 1.1 (*)    All other components within normal limits  URINALYSIS, ROUTINE W REFLEX MICROSCOPIC - Abnormal; Notable for the following:    Color, Urine RED (*)    APPearance TURBID (*)    Hgb urine dipstick LARGE (*)    Bilirubin Urine MODERATE (*)    Ketones, ur 15 (*)    Protein, ur >300 (*)    Nitrite POSITIVE (*)    Leukocytes, UA MODERATE (*)    All other components within normal limits  BASIC METABOLIC PANEL - Abnormal; Notable for the following:    Sodium 133 (*)    GFR calc non Af Amer 82 (*)    All other components within normal limits  CULTURE, BLOOD (ROUTINE X 2)  CULTURE, BLOOD (ROUTINE X 2)  URINE MICROSCOPIC-ADD ON  POC OCCULT BLOOD, ED  TYPE AND SCREEN  PREPARE RBC (CROSSMATCH)   Imaging Review No results found.   EKG Interpretation None      MDM   Final diagnoses:  Anemia  Urinary tract infection    Patient presents with anemia. Patient is noncontributory to history taking. She is chronically ill-appearing. Hemoccult-negative from below. Large amount of blood in the urine and evidence of UTI. Most recent cultures here are show susceptibility to Rocephin. Patient was given 1 g of Rocephin.  Hemoglobin confirmed at 6.5. No evidence of GI bleeding at this time. Patient appears hemodynamically stable. She is actually hypertensive.  Patient was typed and screened for a one unit. Given acute UTI and anemia, patient will be admitted for stabilization. She is DO NOT RESUSCITATE, DO NOT INTUBATE.    Shon Batonourtney F Danaiya Steadman, MD 11/11/13 [phone]

## 2013-11-11 NOTE — H&P (Addendum)
[facility] History and Physical  Jean Buttslizabeth Peruski UJW:[id number] DOB: 11-20-1928 DOA: 11/11/2013  Referring physician: Dr. Wilkie AyeHorton PCP: Florentina JennyRIPP, HENRY, MD   Chief Complaint: Hematuria  HPI: Jean Shaw is a 80 y.o. female  Medical history of hypertension Alzheimer's and frequent UTIs that comes in hematuria. She went to PCP 1 week prior to admission she was found to have a UTI with her on antibiotics she completed a five-day course. A CBC was drawn and appears to be called as her hemoglobin was 6.5. Daughter is at bedside and provide all the history as patient is nonverbal.  In the Ed: Hbg is 6.5, Na 133.   Review of Systems:  Constitutional:  No weight loss, night sweats, Fevers, chills, fatigue.  HEENT:  No headaches, Difficulty swallowing,Tooth/dental problems,Sore throat,  No sneezing, itching, ear ache, nasal congestion, post nasal drip,  Cardio-vascular:  No chest pain, Orthopnea, PND, swelling in lower extremities, anasarca, dizziness, palpitations  GI:  No heartburn, indigestion, abdominal pain, nausea, vomiting, diarrhea, change in bowel habits, loss of appetite  Resp:  No shortness of breath with exertion or at rest. No excess mucus, no productive cough, No non-productive cough, No coughing up of blood.No change in color of mucus.No wheezing.No chest wall deformity  Skin:  no rash or lesions.  GU:  no dysuria, change in color of urine, no urgency or frequency. No flank pain.  Musculoskeletal:  No joint pain or swelling. No decreased range of motion. No back pain.  Psych:  No change in mood or affect. No depression or anxiety. No memory loss.   Past Medical History  Diagnosis Date  . Glaucoma   . Hypertension   . Constipation   . Dementia   . Hearing loss in right ear   . Legally blind     "from glaucoma" (04/13/2013)  . Alzheimer's dementia   . History of blood transfusion     "got 2 units yesterday" (04/13/2013)  . Arthritis     'generalized"  (04/13/2013)  . Recurrent UTI     "been going on since 2011 thru this admission" (04/13/2013)  . Hearing loss of both ears     "no hearing out of right; won't wear aid in left" (04/13/2013)   Past Surgical History  Procedure Laterality Date  . Abdominal hysterectomy     Social History:  reports that she has quit smoking. Her smoking use included Cigarettes. She smoked 0.00 packs per day for 50 years. She has never used smokeless tobacco. She reports that she drinks alcohol. She reports that she does not use illicit drugs.  No Known Allergies  No family history on file.   Prior to Admission medications   Medication Sig Start Date End Date Taking? Authorizing Provider  aspirin EC 81 MG tablet Take 81 mg by mouth daily.   Yes Historical Provider, MD  brimonidine (ALPHAGAN) 0.15 % ophthalmic solution Place 1 drop into both eyes 2 (two) times daily.   Yes Historical Provider, MD  CRANBERRY PO Take 600 mg by mouth 2 (two) times daily.   Yes Historical Provider, MD  docusate sodium (COLACE) 50 MG capsule Take 100 mg by mouth 2 (two) times daily.   Yes Historical Provider, MD  donepezil (ARICEPT) 10 MG tablet Take 10 mg by mouth at bedtime.   Yes Historical Provider, MD  ENSURE (ENSURE) Take 237 mLs by mouth See admin instructions. Takes 1 can two to three times daily, depending on appetite.   Yes Historical Provider, MD  losartan (  COZAAR) 25 MG tablet Take 12.5 mg by mouth every morning.    Yes Historical Provider, MD  polyethylene glycol (MIRALAX / GLYCOLAX) packet Take 17 g by mouth every morning.    Yes Historical Provider, MD   Physical Exam: Filed Vitals:   11/11/13 1540  BP: 182/65  Pulse: 65  Temp:   Resp: 15    BP 182/65  Pulse 65  Temp(Src) 98.3 F (36.8 C) (Rectal)  Resp 15  SpO2 99%  General:  Appears calm and comfortable Eyes: PERRL, normal lids, irises & conjunctiva ENT: grossly normal hearing, lips & tongue Neck: no LAD, masses or thyromegaly Cardiovascular: RRR, no  m/r/g. No LE edema. Respiratory: CTA bilaterally, no w/r/r. Normal respiratory effort. Abdomen: soft, ntnd Skin: no rash or induration seen on limited exam Musculoskeletal: grossly normal tone BUE/BLE Psychiatric: grossly normal mood and affect, speech fluent and appropriate Neurologic: grossly non-focal.          Labs on Admission:  Basic Metabolic Panel:  Recent Labs Lab 11/11/13 1349  NA 133*  K 4.2  CL 98  CO2 22  GLUCOSE 93  BUN 12  CREATININE 0.62  CALCIUM 9.6   Liver Function Tests: No results found for this basename: AST, ALT, ALKPHOS, BILITOT, PROT, ALBUMIN,  in the last 168 hours No results found for this basename: LIPASE, AMYLASE,  in the last 168 hours No results found for this basename: AMMONIA,  in the last 168 hours CBC:  Recent Labs Lab 11/11/13 1349  WBC 6.3  NEUTROABS 3.5  HGB 6.5*  HCT 21.3*  MCV 67.0*  PLT 428*   Cardiac Enzymes: No results found for this basename: CKTOTAL, CKMB, CKMBINDEX, TROPONINI,  in the last 168 hours  BNP (last 3 results) No results found for this basename: PROBNP,  in the last 8760 hours CBG: No results found for this basename: GLUCAP,  in the last 168 hours  Radiological Exams on Admission: No results found.  EKG: Independently reviewed. none  Assessment/Plan Urinary tract infection/Hematuria: - Agree with Rocephin, we'll get urine cultures and blood cultures. Doubt urine culture will grow as she was recently on antibiotics. - Hematuria is probably from her UTI. - IV fluids strict I.'s and O.  Dementia - Continue current home meds.  Hypertension - Repeat her on high resume home medications. - Continue to monitor.  Severe protein caloric malnutrition.  Microcytic anemia: - MCV low, we'll go and check a ferritin. Agree with one unit of blood transfusions. - Probably benefit from IV iron. Into a gentle facial benefit from colonoscopy I have discussed this with the daughter.    Code Status: DO NOT  RESUSCITATE Family Communication: daughter Disposition Plan: inpatient  Time spent: 80 minutes  Marinda Elk [facility] Pager [phone]

## 2013-11-11 NOTE — ED Notes (Signed)
Admitting MD at bedside.

## 2013-11-11 NOTE — ED Notes (Signed)
Per [facility] pt came from home where she was told by Dr. Redmond Schoolripp that her hbg was 6.4. Pt also recently diagnosed with UTI and blood in urine. Pt has hx of alzheimer's, DNR documentation present upon arrival.

## 2013-11-12 ENCOUNTER — Encounter (HOSPITAL_COMMUNITY): Payer: Self-pay | Admitting: General Practice

## 2013-11-12 LAB — CBC
HCT: 25.2 % — ABNORMAL LOW (ref 36.0–46.0)
Hemoglobin: 8.1 g/dL — ABNORMAL LOW (ref 12.0–15.0)
MCH: 22.6 pg — ABNORMAL LOW (ref 26.0–34.0)
MCHC: 32.1 g/dL (ref 30.0–36.0)
MCV: 70.4 fL — ABNORMAL LOW (ref 78.0–100.0)
PLATELETS: 359 10*3/uL (ref 150–400)
RBC: 3.58 MIL/uL — AB (ref 3.87–5.11)
RDW: 20.2 % — AB (ref 11.5–15.5)
WBC: 6.8 10*3/uL (ref 4.0–10.5)

## 2013-11-12 LAB — COMPREHENSIVE METABOLIC PANEL
ALK PHOS: 65 U/L (ref 39–117)
ALT: 6 U/L (ref 0–35)
AST: 17 U/L (ref 0–37)
Albumin: 2.4 g/dL — ABNORMAL LOW (ref 3.5–5.2)
BILIRUBIN TOTAL: 0.6 mg/dL (ref 0.3–1.2)
BUN: 12 mg/dL (ref 6–23)
CHLORIDE: 99 meq/L (ref 96–112)
CO2: 18 mEq/L — ABNORMAL LOW (ref 19–32)
Calcium: 9.2 mg/dL (ref 8.4–10.5)
Creatinine, Ser: 0.55 mg/dL (ref 0.50–1.10)
GFR, EST NON AFRICAN AMERICAN: 85 mL/min — AB (ref >90)
GLUCOSE: 84 mg/dL (ref 70–99)
POTASSIUM: 3.6 meq/L — AB (ref 3.7–5.3)
Sodium: 133 mEq/L — ABNORMAL LOW (ref 137–147)
Total Protein: 7.3 g/dL (ref 6.0–8.3)

## 2013-11-12 MED ORDER — FERUMOXYTOL INJECTION 510 MG/17 ML
1020.0000 mg | Freq: Once | INTRAVENOUS | Status: AC
  Administered 2013-11-12: 1020 mg via INTRAVENOUS
  Filled 2013-11-12: qty 34

## 2013-11-12 NOTE — Progress Notes (Signed)
Blood completed at 2000. 11/11/13

## 2013-11-12 NOTE — Progress Notes (Addendum)
[facility] PROGRESS NOTE  Jean Shaw ZOX:[id number] DOB: 08/05/1929 DOA: 11/11/2013 PCP: Florentina JennyRIPP, HENRY, MD  Assessment/Plan: Urinary tract infection/Hematuria:  - Agree with Rocephin, we'll get urine cultures and blood cultures. Doubt urine culture will grow as she was recently on antibiotics.  - Hematuria is probably from her UTI.  - IV fluids strict I.'s and O.  Dementia  - Continue current home meds.  -Poor by mouth intake noted as above which may be secondary to worsening dementia although underlying urinary tract infection could be contributing factor -Will start on pured diet today, follow and if not tolerating will discuss options including palliative consultation with daughter Hypertension  - Repeat her on high resume home medications.  - Continue to monitor.  Severe protein caloric malnutrition.  -see as discussed above Microcytic anemia/Iron deficiency anemia:  - MCV low, ferritin 10 -given iron/feraheme 3/6 -Hemoglobin improved to 8.1 status post transfusion>> trending down today to 7.4 -Hematuria still present but decreasing -Will transfuse another unit of packed red blood cell, follow recheck -Admitting M.D. discussed with daughter the patient will probably not benefit from colonoscopy and I agree. Co no de Status: full Family Communication: Daughter at bedside Disposition Plan: Pending clinical course   Consultants:  None  Procedures:  None  Antibiotics:  Rocephin started 3/5  HPI/Subjective: Patient noncommunicative, daughter at bedside and states still some hematuria but decreasing Objective: Filed Vitals:   11/12/13 1344  BP: 152/90  Pulse: 73  Temp: 97.6 F (36.4 C)  Resp: 16    Intake/Output Summary (Last 24 hours) at 11/12/13 1557 Last data filed at 11/12/13 0500  Gross per 24 hour  Intake  422.5 ml  Output      0 ml  Net  422.5 ml   Filed Weights   11/11/13 1845 11/12/13 0629  Weight: 45.949 kg (101 lb 4.8 oz) 45.949  kg (101 lb 4.8 oz)    Exam:  General:   blind, In NAD, grimaces to voice and deep touch, otherwise nonverbal Cardiovascular: RRR, nl S1 s2 Respiratory: CTAB Abdomen: soft +BS NT/ND, no masses palpable Extremities: No cyanosis and no edema    Data Reviewed: Basic Metabolic Panel:  Recent Labs Lab 11/11/13 1349 11/12/13 0700  NA 133* 133*  K 4.2 3.6*  CL 98 99  CO2 22 18*  GLUCOSE 93 84  BUN 12 12  CREATININE 0.62 0.55  CALCIUM 9.6 9.2   Liver Function Tests:  Recent Labs Lab 11/12/13 0700  AST 17  ALT 6  ALKPHOS 65  BILITOT 0.6  PROT 7.3  ALBUMIN 2.4*   No results found for this basename: LIPASE, AMYLASE,  in the last 168 hours No results found for this basename: AMMONIA,  in the last 168 hours CBC:  Recent Labs Lab 11/11/13 1349 11/12/13 0700  WBC 6.3 6.8  NEUTROABS 3.5  --   HGB 6.5* 8.1*  HCT 21.3* 25.2*  MCV 67.0* 70.4*  PLT 428* 359   Cardiac Enzymes: No results found for this basename: CKTOTAL, CKMB, CKMBINDEX, TROPONINI,  in the last 168 hours BNP (last 3 results) No results found for this basename: PROBNP,  in the last 8760 hours CBG: No results found for this basename: GLUCAP,  in the last 168 hours  No results found for this or any previous visit (from the past 240 hour(s)).   Studies: No results found.  Scheduled Meds: . aspirin EC  81 mg Oral Daily  . brimonidine  1 drop Both Eyes BID  . cefTRIAXone (  ROCEPHIN)  IV  1 g Intravenous Q24H  . docusate sodium  100 mg Oral BID  . donepezil  10 mg Oral QHS  . ferumoxytol  1,020 mg Intravenous Once  . heparin  5,000 Units Subcutaneous 3 times per day  . losartan  12.5 mg Oral q morning - 10a  . polyethylene glycol  17 g Oral q morning - 10a   Continuous Infusions: . sodium chloride 75 mL/hr at 11/12/13 1158    Active Problems:   Dementia   Hypertension   Hematuria   UTI (lower urinary tract infection)   Protein-calorie malnutrition, severe   Urinary tract infection    Time  spent: 25    [facility] C  [facility] Pager [phone]. If 7PM-7AM, please contact night-coverage at [email or web address], password [facility] 11/12/2013, 3:57 PM  LOS: 1 day

## 2013-11-12 NOTE — Progress Notes (Signed)
INITIAL NUTRITION ASSESSMENT  DOCUMENTATION CODES Per approved criteria  -Underweight   INTERVENTION: Diet advancement per MD (Dysphagia 1/Pureed foods per pt's daughter) Add Ensure Complete TID once diet advanced RD to continue to monitor  NUTRITION DIAGNOSIS: Inadequate oral intake related to acute illness as evidenced by pt refusing food past 3 days and currently NPO.   Goal: Pt to meet >/= 90% of their estimated nutrition needs   Monitor:  Diet advancement/PO intake, weight trends, labs  Reason for Assessment: Low Braden  80 y.o. female  Admitting Dx: <principal problem not specified>  ASSESSMENT: 80 y.o. female with medical history of hypertension Alzheimer's and frequent UTIs that comes in with hematuria. She went to PCP 1 week prior to admission she was found to have a UTI with her on antibiotics she completed a five-day course. A CBC was drawn and appears to be called as her hemoglobin was 6.5.  Per pt's daughter at bedside pt has not wanted to eat or drink for the past 3 days. Pt usually has a good appetite and eats 3 meals daily with 2 Ensure shakes daily per daughter. Pt needs pureed foods per daughter. Pt is underweight but, weight history shows pt has gained 3 lbs in the past several months and daughter reports pt has been maintaining her weight.  Pt asleep at time of visit. Note pt with moderate muscle wasting at temples and clavicles and moderate fat wasting in arms.   Height: Ht Readings from Last 1 Encounters:  04/13/13 5\' 5"  (1.651 m)    Weight: Wt Readings from Last 1 Encounters:  11/12/13 101 lb 4.8 oz (45.949 kg)    Ideal Body Weight: 125 lbs  % Ideal Body Weight: 81%  Wt Readings from Last 10 Encounters:  11/12/13 101 lb 4.8 oz (45.949 kg)  04/13/13 98 lb 8.7 oz (44.7 kg)    Usual Body Weight: 100 lbs  % Usual Body Weight: 100%  BMI:  Body mass index is 16.86 kg/(m^2).  Estimated Nutritional Needs: Kcal: 1380-1600 Protein: 55-65  grams Fluid: 1.4 L/day  Skin: intact  Diet Order: NPO  EDUCATION NEEDS: -No education needs identified at this time   Intake/Output Summary (Last 24 hours) at 11/12/13 1125 Last data filed at 11/12/13 0500  Gross per 24 hour  Intake  422.5 ml  Output      0 ml  Net  422.5 ml    Last BM: 3/2   Labs:   Recent Labs Lab 11/11/13 1349 11/12/13 0700  NA 133* 133*  K 4.2 3.6*  CL 98 99  CO2 22 18*  BUN 12 12  CREATININE 0.62 0.55  CALCIUM 9.6 9.2  GLUCOSE 93 84    CBG (last 3)  No results found for this basename: GLUCAP,  in the last 72 hours  Scheduled Meds: . aspirin EC  81 mg Oral Daily  . brimonidine  1 drop Both Eyes BID  . cefTRIAXone (ROCEPHIN)  IV  1 g Intravenous Q24H  . docusate sodium  100 mg Oral BID  . donepezil  10 mg Oral QHS  . heparin  5,000 Units Subcutaneous 3 times per day  . losartan  12.5 mg Oral q morning - 10a  . polyethylene glycol  17 g Oral q morning - 10a    Continuous Infusions: . sodium chloride 75 mL/hr at 11/11/13 2332    Past Medical History  Diagnosis Date  . Glaucoma   . Hypertension   . Constipation   . Dementia   .  Hearing loss in right ear   . Legally blind     "from glaucoma" (04/13/2013)  . Alzheimer's dementia   . History of blood transfusion     "got 2 units yesterday" (04/13/2013)  . Arthritis     'generalized" (04/13/2013)  . Recurrent UTI     "been going on since 2011 thru this admission" (04/13/2013)  . Hearing loss of both ears     "no hearing out of right; won't wear aid in left" (04/13/2013)    Past Surgical History  Procedure Laterality Date  . Abdominal hysterectomy      Ian Malkin RD, LDN Inpatient Clinical Dietitian Pager: [phone] After Hours Pager: [phone]

## 2013-11-13 LAB — CBC
HEMATOCRIT: 23.4 % — AB (ref 36.0–46.0)
HEMOGLOBIN: 7.4 g/dL — AB (ref 12.0–15.0)
MCH: 22.8 pg — ABNORMAL LOW (ref 26.0–34.0)
MCHC: 31.6 g/dL (ref 30.0–36.0)
MCV: 72.2 fL — ABNORMAL LOW (ref 78.0–100.0)
Platelets: 319 10*3/uL (ref 150–400)
RBC: 3.24 MIL/uL — AB (ref 3.87–5.11)
RDW: 20.5 % — ABNORMAL HIGH (ref 11.5–15.5)
WBC: 5.8 10*3/uL (ref 4.0–10.5)

## 2013-11-13 LAB — HEMOGLOBIN AND HEMATOCRIT, BLOOD
HCT: 27 % — ABNORMAL LOW (ref 36.0–46.0)
Hemoglobin: 8.9 g/dL — ABNORMAL LOW (ref 12.0–15.0)

## 2013-11-13 LAB — PREPARE RBC (CROSSMATCH)

## 2013-11-14 DIAGNOSIS — R319 Hematuria, unspecified: Secondary | ICD-10-CM

## 2013-11-14 LAB — TYPE AND SCREEN
ABO/RH(D): A NEG
ANTIBODY SCREEN: NEGATIVE
UNIT DIVISION: 0
Unit division: 0

## 2013-11-14 LAB — CBC
HEMATOCRIT: 26.2 % — AB (ref 36.0–46.0)
Hemoglobin: 8.4 g/dL — ABNORMAL LOW (ref 12.0–15.0)
MCH: 23.9 pg — ABNORMAL LOW (ref 26.0–34.0)
MCHC: 32.1 g/dL (ref 30.0–36.0)
MCV: 74.6 fL — AB (ref 78.0–100.0)
Platelets: 311 10*3/uL (ref 150–400)
RBC: 3.51 MIL/uL — AB (ref 3.87–5.11)
RDW: 21.5 % — ABNORMAL HIGH (ref 11.5–15.5)
WBC: 6.7 10*3/uL (ref 4.0–10.5)

## 2013-11-14 MED ORDER — CEFUROXIME AXETIL 500 MG PO TABS: 500.0000 mg | ORAL_TABLET | Freq: Two times a day (BID) | ORAL | Status: DC

## 2013-11-14 MED ORDER — ASPIRIN EC 81 MG PO TBEC: 81.0000 mg | DELAYED_RELEASE_TABLET | Freq: Every day | ORAL | Status: DC

## 2013-11-14 MED ORDER — DSS 100 MG PO CAPS: 100.0000 mg | ORAL_CAPSULE | Freq: Two times a day (BID) | ORAL | Status: AC

## 2013-11-14 NOTE — Discharge Summary (Signed)
Physician Discharge Summary  Jean Shaw ZOX:[id number] DOB: Nov 16, 1928 DOA: 11/11/2013  PCP: Jean Jenny, MD  Admit date: 11/11/2013 Discharge date: 11/14/2013  Time spent: >30 minutes  Recommendations for Outpatient Follow-up:  Follow-up Information   Follow up with Jean Jenny, MD. (in 1week, call for appt upon discharge)    Specialty:  Family Medicine   Contact information:   [address] [place] [address] [phone]        Discharge Diagnoses:  Active Problems:   Dementia   Hypertension   Hematuria   UTI (lower urinary tract infection)   Protein-calorie malnutrition, severe   Urinary tract infection   Discharge Condition: Improved/stable  Diet recommendation: Dysphagia 1/pure  Filed Weights   11/11/13 1845 11/12/13 0629 11/14/13 0640  Weight: 45.949 kg (101 lb 4.8 oz) 45.949 kg (101 lb 4.8 oz) 45.995 kg (101 lb 6.4 oz)    History of present illness:  Jean Shaw is a 80 y.o. female with Medical history of hypertension Alzheimer's and frequent UTIs that comes in hematuria. She went to PCP 1 week prior to admission she was found to have a UTI with her on antibiotics she completed a five-day course. A CBC was drawn and appears to be called as her hemoglobin was 6.5. Daughter is at bedside and provide all the history as patient is nonverbal. She was admitted for further evaluation and management.   Hospital Course:  Urinary tract infection/Hematuria:  -Upon admission agent was started on empiric Rocephin, blood cultures obtained and to date no growth. No urine cultures were obtained on admission as patient had been on antibiotics for 5 days. -The impression was that her Hematuria is probably from her UTI. Family instructed to hold off aspirin for a week - The hematuria has improved with treatment of the UTI as well as her by mouth intake. -She'll be discharged on oral antibiotics at this time and is to followup with her PCP  outpatient. Dementia  -Continue current home meds.  -Poor by mouth intake noted as above which may be secondary to worsening dementia although underlying urinary tract infection could be contributing factor; as discussed above appear intake improved she meant of the UTI and she is to continue the pured diet upon discharge and followup with her PCP. Would recommend PCP to discuss/consider outpatient palliative/hospice consultation if by mouth intake/overall clinical decline . Hypertension  She was maintained on her outpatient medications and is to continue upon discharge.  Severe protein caloric malnutrition.  - Continue ensure supplements on discharge - continue pured diet as discussed above  Microcytic anemia/Iron deficiency anemia:  - MCV low, ferritin 10  -given iron/feraheme 3/6  -Hematuria contributing factor -Hemoglobin improved to 8.1 status post transfusion>> trending down  to 7.4 on 3/7>> she was transfused another unit of packed red blood cells and hemoglobin on recheck today 8.4. -Admitting M.D. discussed with daughter the patient will probably not benefit from colonoscopy and I agree.The patient is to follow up with PCP outpatient   Procedures:  none  Consultations:  none  Discharge Exam: Filed Vitals:   11/14/13 0640  BP: 167/65  Pulse: 66  Temp: 98.6 F (37 C)  Resp: 17   Exam:  General: blind, In NAD,  propped up in bed today and being fed lunch,  nonverbal but follows some simple commands  Cardiovascular: RRR, nl S1 s2  Respiratory: CTAB  Abdomen: soft +BS NT/ND, no masses palpable  Extremities: No cyanosis and no edema  Discharge Instructions  Discharge Orders   Future Orders Complete By Expires   DIET - DYS 1  As directed    Increase activity slowly  As directed        Medication List         aspirin EC 81 MG tablet  Take 1 tablet (81 mg total) by mouth daily.     brimonidine 0.15 % ophthalmic solution  Commonly known as:  ALPHAGAN   Place 1 drop into both eyes 2 (two) times daily.     cefUROXime 500 MG tablet  Commonly known as:  CEFTIN  Take 1 tablet (500 mg total) by mouth 2 (two) times daily with a meal.     CRANBERRY PO  Take 600 mg by mouth 2 (two) times daily.     donepezil 10 MG tablet  Commonly known as:  ARICEPT  Take 10 mg by mouth at bedtime.     DSS 100 MG Caps  Take 100 mg by mouth 2 (two) times daily.     ENSURE  Take 237 mLs by mouth See admin instructions. Takes 1 can two to three times daily, depending on appetite.     losartan 25 MG tablet  Commonly known as:  COZAAR  Take 12.5 mg by mouth every morning.     polyethylene glycol packet  Commonly known as:  MIRALAX / GLYCOLAX  Take 17 g by mouth every morning.       No Known Allergies     Follow-up Information   Follow up with Jean JennyRIPP, HENRY, MD. (in 1week, call for appt upon discharge)    Specialty:  Family Medicine   Contact information:   [address] [place] [id number] [phone]        The results of significant diagnostics from this hospitalization (including imaging, microbiology, ancillary and laboratory) are listed below for reference.    Significant Diagnostic Studies: No results found.  Microbiology: Recent Results (from the past 240 hour(s))  CULTURE, BLOOD (ROUTINE X 2)     Status: None   Collection Time    11/11/13  5:23 PM      Result Value Ref Range Status   Specimen Description BLOOD HAND RIGHT   Final   Special Requests BOTTLES DRAWN AEROBIC AND ANAEROBIC 5CC   Final   Culture  Setup Time     Final   Value: 11/12/2013 02:01     Performed at [facility]   Culture     Final   Value:        BLOOD CULTURE RECEIVED NO GROWTH TO DATE CULTURE WILL BE HELD FOR 5 DAYS BEFORE ISSUING A FINAL NEGATIVE REPORT     Performed at [facility]   Report Status PENDING   Incomplete  CULTURE, BLOOD (ROUTINE X 2)     Status: None   Collection Time    11/11/13  5:45 PM       Result Value Ref Range Status   Specimen Description BLOOD HAND RIGHT   Final   Special Requests BOTTLES DRAWN AEROBIC ONLY 5CC   Final   Culture  Setup Time     Final   Value: 11/12/2013 02:01     Performed at [facility]   Culture     Final   Value:        BLOOD CULTURE RECEIVED NO GROWTH TO DATE CULTURE WILL BE HELD FOR 5 DAYS BEFORE ISSUING A FINAL NEGATIVE REPORT  Performed at [facility]   Report Status PENDING   Incomplete     Labs: Basic Metabolic Panel:  Recent Labs Lab 11/11/13 1349 11/12/13 0700  NA 133* 133*  K 4.2 3.6*  CL 98 99  CO2 22 18*  GLUCOSE 93 84  BUN 12 12  CREATININE 0.62 0.55  CALCIUM 9.6 9.2   Liver Function Tests:  Recent Labs Lab 11/12/13 0700  AST 17  ALT 6  ALKPHOS 65  BILITOT 0.6  PROT 7.3  ALBUMIN 2.4*   No results found for this basename: LIPASE, AMYLASE,  in the last 168 hours No results found for this basename: AMMONIA,  in the last 168 hours CBC:  Recent Labs Lab 11/11/13 1349 11/12/13 0700 11/13/13 1355 11/13/13 2249 11/14/13 0655  WBC 6.3 6.8 5.8  --  6.7  NEUTROABS 3.5  --   --   --   --   HGB 6.5* 8.1* 7.4* 8.9* 8.4*  HCT 21.3* 25.2* 23.4* 27.0* 26.2*  MCV 67.0* 70.4* 72.2*  --  74.6*  PLT 428* 359 319  --  311   Cardiac Enzymes: No results found for this basename: CKTOTAL, CKMB, CKMBINDEX, TROPONINI,  in the last 168 hours BNP: BNP (last 3 results) No results found for this basename: PROBNP,  in the last 8760 hours CBG: No results found for this basename: GLUCAP,  in the last 168 hours     Signed:  Chrishun Scheer C  [facility] 11/14/2013, 2:17 PM

## 2013-11-14 NOTE — Progress Notes (Signed)
Patient is medically stable for D/C home today. CSW received call from RN to arrange EMS ([facility]) for transport home. CSW completed medical necessity form and put it with the gold DNR form on the shadow chart. CSW confirmed address with patient's daughter. Please reconsult if further social work needs arise. CSW signing off.   Jetta LoutBailey Morgan, LCSWA Weekend CSW [phone]

## 2013-11-18 LAB — CULTURE, BLOOD (ROUTINE X 2)
CULTURE: NO GROWTH
Culture: NO GROWTH

## 2013-12-07 ENCOUNTER — Emergency Department (HOSPITAL_COMMUNITY): Payer: Medicare Other

## 2013-12-07 ENCOUNTER — Encounter (HOSPITAL_COMMUNITY): Payer: Self-pay | Admitting: Emergency Medicine

## 2013-12-07 ENCOUNTER — Inpatient Hospital Stay (HOSPITAL_COMMUNITY)
Admission: EM | Admit: 2013-12-07 | Discharge: 2013-12-09 | DRG: 689 | Disposition: A | Discharge status: Hospice | Payer: Medicare Other | Attending: Internal Medicine | Admitting: Internal Medicine

## 2013-12-07 DIAGNOSIS — H547 Unspecified visual loss: Secondary | ICD-10-CM | POA: Diagnosis present

## 2013-12-07 DIAGNOSIS — Z66 Do not resuscitate: Secondary | ICD-10-CM | POA: Diagnosis present

## 2013-12-07 DIAGNOSIS — H548 Legal blindness, as defined in USA: Secondary | ICD-10-CM | POA: Diagnosis present

## 2013-12-07 DIAGNOSIS — F0280 Dementia in other diseases classified elsewhere without behavioral disturbance: Secondary | ICD-10-CM | POA: Diagnosis present

## 2013-12-07 DIAGNOSIS — Z9889 Other specified postprocedural states: Secondary | ICD-10-CM

## 2013-12-07 DIAGNOSIS — Z7401 Bed confinement status: Secondary | ICD-10-CM

## 2013-12-07 DIAGNOSIS — R918 Other nonspecific abnormal finding of lung field: Secondary | ICD-10-CM | POA: Diagnosis present

## 2013-12-07 DIAGNOSIS — D638 Anemia in other chronic diseases classified elsewhere: Secondary | ICD-10-CM | POA: Diagnosis present

## 2013-12-07 DIAGNOSIS — H409 Unspecified glaucoma: Secondary | ICD-10-CM | POA: Diagnosis present

## 2013-12-07 DIAGNOSIS — IMO0002 Reserved for concepts with insufficient information to code with codable children: Secondary | ICD-10-CM

## 2013-12-07 DIAGNOSIS — Z515 Encounter for palliative care: Secondary | ICD-10-CM

## 2013-12-07 DIAGNOSIS — Z87891 Personal history of nicotine dependence: Secondary | ICD-10-CM

## 2013-12-07 DIAGNOSIS — R627 Adult failure to thrive: Secondary | ICD-10-CM | POA: Diagnosis present

## 2013-12-07 DIAGNOSIS — R531 Weakness: Secondary | ICD-10-CM

## 2013-12-07 DIAGNOSIS — E43 Unspecified severe protein-calorie malnutrition: Secondary | ICD-10-CM | POA: Diagnosis present

## 2013-12-07 DIAGNOSIS — E860 Dehydration: Secondary | ICD-10-CM | POA: Diagnosis present

## 2013-12-07 DIAGNOSIS — Z87442 Personal history of urinary calculi: Secondary | ICD-10-CM

## 2013-12-07 DIAGNOSIS — N390 Urinary tract infection, site not specified: Principal | ICD-10-CM | POA: Diagnosis present

## 2013-12-07 DIAGNOSIS — R52 Pain, unspecified: Secondary | ICD-10-CM

## 2013-12-07 DIAGNOSIS — G309 Alzheimer's disease, unspecified: Secondary | ICD-10-CM | POA: Diagnosis present

## 2013-12-07 DIAGNOSIS — D649 Anemia, unspecified: Secondary | ICD-10-CM

## 2013-12-07 DIAGNOSIS — Z87440 Personal history of urinary (tract) infections: Secondary | ICD-10-CM

## 2013-12-07 DIAGNOSIS — R319 Hematuria, unspecified: Secondary | ICD-10-CM

## 2013-12-07 DIAGNOSIS — F0390 Unspecified dementia without behavioral disturbance: Secondary | ICD-10-CM

## 2013-12-07 DIAGNOSIS — H543 Unqualified visual loss, both eyes: Secondary | ICD-10-CM

## 2013-12-07 DIAGNOSIS — I10 Essential (primary) hypertension: Secondary | ICD-10-CM | POA: Diagnosis present

## 2013-12-07 DIAGNOSIS — Z9849 Cataract extraction status, unspecified eye: Secondary | ICD-10-CM

## 2013-12-07 DIAGNOSIS — M129 Arthropathy, unspecified: Secondary | ICD-10-CM | POA: Diagnosis present

## 2013-12-07 DIAGNOSIS — Z79899 Other long term (current) drug therapy: Secondary | ICD-10-CM

## 2013-12-07 DIAGNOSIS — Z7982 Long term (current) use of aspirin: Secondary | ICD-10-CM

## 2013-12-07 DIAGNOSIS — H9190 Unspecified hearing loss, unspecified ear: Secondary | ICD-10-CM | POA: Diagnosis present

## 2013-12-07 DIAGNOSIS — D500 Iron deficiency anemia secondary to blood loss (chronic): Secondary | ICD-10-CM | POA: Diagnosis present

## 2013-12-07 LAB — CBC WITH DIFFERENTIAL/PLATELET
BASOS PCT: 0 % (ref 0–1)
Basophils Absolute: 0 10*3/uL (ref 0.0–0.1)
Eosinophils Absolute: 0 10*3/uL (ref 0.0–0.7)
Eosinophils Relative: 0 % (ref 0–5)
HCT: 22.5 % — ABNORMAL LOW (ref 36.0–46.0)
Hemoglobin: 7.1 g/dL — ABNORMAL LOW (ref 12.0–15.0)
Lymphocytes Relative: 10 % — ABNORMAL LOW (ref 12–46)
Lymphs Abs: 1 10*3/uL (ref 0.7–4.0)
MCH: 26.9 pg (ref 26.0–34.0)
MCHC: 31.6 g/dL (ref 30.0–36.0)
MCV: 85.2 fL (ref 78.0–100.0)
MONOS PCT: 9 % (ref 3–12)
Monocytes Absolute: 0.9 10*3/uL (ref 0.1–1.0)
NEUTROS PCT: 81 % — AB (ref 43–77)
Neutro Abs: 8.3 10*3/uL — ABNORMAL HIGH (ref 1.7–7.7)
PLATELETS: 379 10*3/uL (ref 150–400)
RBC: 2.64 MIL/uL — AB (ref 3.87–5.11)
RDW: 26.6 % — ABNORMAL HIGH (ref 11.5–15.5)
WBC: 10.2 10*3/uL (ref 4.0–10.5)

## 2013-12-07 LAB — I-STAT CHEM 8, ED
BUN: 44 mg/dL — AB (ref 6–23)
CREATININE: 0.9 mg/dL (ref 0.50–1.10)
Calcium, Ion: 1.34 mmol/L — ABNORMAL HIGH (ref 1.13–1.30)
Chloride: 104 mEq/L (ref 96–112)
Glucose, Bld: 130 mg/dL — ABNORMAL HIGH (ref 70–99)
HCT: 23 % — ABNORMAL LOW (ref 36.0–46.0)
HEMOGLOBIN: 7.8 g/dL — AB (ref 12.0–15.0)
POTASSIUM: 4.5 meq/L (ref 3.7–5.3)
SODIUM: 139 meq/L (ref 137–147)
TCO2: 21 mmol/L (ref 0–100)

## 2013-12-07 LAB — I-STAT CG4 LACTIC ACID, ED: LACTIC ACID, VENOUS: 2.09 mmol/L (ref 0.5–2.2)

## 2013-12-07 LAB — URINALYSIS, ROUTINE W REFLEX MICROSCOPIC
Glucose, UA: NEGATIVE mg/dL
Ketones, ur: 40 mg/dL — AB
NITRITE: POSITIVE — AB
Protein, ur: >300 mg/dL — AB
SPECIFIC GRAVITY, URINE: 1.027 (ref 1.005–1.030)
UROBILINOGEN UA: 1 mg/dL (ref 0.0–1.0)
pH: 5 (ref 5.0–8.0)

## 2013-12-07 LAB — URINE MICROSCOPIC-ADD ON

## 2013-12-07 LAB — POC OCCULT BLOOD, ED: FECAL OCCULT BLD: NEGATIVE

## 2013-12-07 MED ORDER — DEXTROSE 5 % IV SOLN
1.0000 g | Freq: Once | INTRAVENOUS | Status: AC
  Administered 2013-12-07: 1 g via INTRAVENOUS
  Filled 2013-12-07: qty 10

## 2013-12-07 MED ORDER — SODIUM CHLORIDE 0.9 % IV SOLN
INTRAVENOUS | Status: DC
  Administered 2013-12-07: 21:00:00 via INTRAVENOUS

## 2013-12-07 MED ORDER — HYDROMORPHONE HCL PF 1 MG/ML IJ SOLN: 1.0000 mg | INTRAMUSCULAR | Status: AC | PRN

## 2013-12-07 MED ORDER — DEXTROSE 5 % IV SOLN
1.0000 g | INTRAVENOUS | Status: DC
  Administered 2013-12-07 – 2013-12-08 (×2): 1 g via INTRAVENOUS
  Filled 2013-12-07 (×2): qty 10

## 2013-12-07 MED ORDER — ONDANSETRON HCL 4 MG/2ML IJ SOLN: 4.0000 mg | Freq: Three times a day (TID) | INTRAMUSCULAR | Status: AC | PRN

## 2013-12-07 MED ORDER — SODIUM CHLORIDE 0.9 % IV BOLUS (SEPSIS)
1000.0000 mL | Freq: Once | INTRAVENOUS | Status: AC
  Administered 2013-12-07: 1000 mL via INTRAVENOUS

## 2013-12-07 NOTE — ED Provider Notes (Signed)
CSN: [id number]     Arrival date & time 12/07/13  1750 History   First MD Initiated Contact with Patient 12/07/13 1828     Chief Complaint  Patient presents with  . Failure To Thrive     (Consider location/radiation/quality/duration/timing/severity/associated sxs/prior Treatment) HPI  80 year old female with history of recurrent UTIs, Alzheimer, legally blind and hearing loss was brought here via EMS from home for evaluation of failure to thrive. History obtained through daughter who was at bedside. For the past 3 days patient hasn't been eating or drinking as normal.  Daughter states pt's jaw would lock up and she refused to eat or drink.  Daughter only able to give small amount of fluid and some icescream only. Patient's daughter has noticed blood in her urine similar to prior UTI. She also noticed blood clots in the urine which concerns her. Patient has prior anemia which requires a transfusion. States she has history of UTI in the past and at times she would act this way. Patient's daughter is concern the patient may be dehydrated from lack of fluid intake. Otherwise she has not noticed any fever, chills, productive cough, pain complaint, or rash. Has not noticed any pressure ulcer. Patient is bedbound.    Past Medical History  Diagnosis Date  . Glaucoma   . Hypertension   . Constipation   . Legally blind     "from glaucoma" (04/13/2013)  . History of blood transfusion 04/2013; 11/2013    "starts bleeding in urine"  . Arthritis     'generalized" (04/13/2013)  . Recurrent UTI     "been going on since 2011 thru this admission" (11/12/2013)  . Hearing loss of both ears     "no hearing out of right; won't wear aid in left" (04/13/2013)  . Alzheimer's dementia   . Kidney stone     "Dr. Nicholes Mango said she doesn't need it taken out; think it's been in since 2011"   Past Surgical History  Procedure Laterality Date  . Abdominal hysterectomy    . Cataract extraction, bilateral Bilateral    History  reviewed. No pertinent family history. History  Substance Use Topics  . Smoking status: Former Smoker -- 1.00 packs/day for 50 years    Types: Cigarettes  . Smokeless tobacco: Never Used     Comment:  "stopped smoking in the 1990's"  . Alcohol Use: Yes     Comment: 04/13/2013 "used to drink; never had a problem w/it"   OB History   Grav Para Term Preterm Abortions TAB SAB Ect Mult Living                 Review of Systems  All other systems reviewed and are negative.      Allergies  Review of patient's allergies indicates no known allergies.  Home Medications   Current Outpatient Rx  Name  Route  Sig  Dispense  Refill  . aspirin EC 81 MG tablet   Oral   Take 1 tablet (81 mg total) by mouth daily.           HOLD OFF ASPIRIN FOR 1WEEK, THEN RESUME   . brimonidine (ALPHAGAN) 0.15 % ophthalmic solution   Both Eyes   Place 1 drop into both eyes 2 (two) times daily.         Marland Kitchen CRANBERRY PO   Oral   Take 600 mg by mouth 2 (two) times daily.         Marland Kitchen docusate sodium 100 MG CAPS  Oral   Take 100 mg by mouth 2 (two) times daily.   10 capsule   0   . donepezil (ARICEPT) 10 MG tablet   Oral   Take 10 mg by mouth at bedtime.         . ENSURE (ENSURE)   Oral   Take 237 mLs by mouth See admin instructions. Takes 1 can two to three times daily, depending on appetite.         Marland Kitchen losartan (COZAAR) 25 MG tablet   Oral   Take 12.5 mg by mouth every morning.          . polyethylene glycol (MIRALAX / GLYCOLAX) packet   Oral   Take 17 g by mouth every morning.           SpO2 99% Physical Exam  Nursing note and vitals reviewed. Constitutional:  Thin frail appearing cachetic african [place] female who is deaf and blind.    HENT:  Head: Normocephalic and atraumatic.  Lips are dry, pt refuses to open mouth.    Neck: Neck supple.  No nuchal rigidity  Cardiovascular: Normal rate and regular rhythm.   Pulmonary/Chest: Effort normal and breath sounds normal.   Abdominal: Soft. She exhibits no distension. There is no tenderness.  Musculoskeletal:  Atrophy to all 4 extremities  Skin: No rash noted.  No pressure sores  Psychiatric: She has a normal mood and affect.    ED Course  Procedures (including critical care time)  8:22 PM Pt here with failure to thrive.  Has not been eating/drinking x 3-4 days.  UA with evidence of UTI.  Pt appears dehydrated.  IVF given, rocephin given.  I discussed with Dr. Effie Shy who saw and evaluate pt and recommend IV rocephin daily, IVF at 75cc/hr for 5 days and order homehealth nursing care.  Pt also has CXR with numerous round lesions throughout both lungs, concerning either for septic emboli or tuberculosis.  Metastatic disease can also have this appearance.  Dr. Effie Shy recommend allowing her PCP to evaluate further.    9:04 PM Pt has hematuria and UTI.  Hemoccult negative.  Hgb is 7.1, which is near the level of prior blood transfusion back in March 5th when she was admitted.  Given that pt has anemia, appears dehydrated and not eating, along with UTI, will consult for admission for further management.  She is however hemodynamically stable at this time.    9:46 PM i have consulted with [facility], Dr. Lovell Sheehan, who agrees to admit pt to med surg, team 8.  She will also order quantiferon TB gold assay to evaluate for possible TB due to abnormal CXR.  Will continue IV hydration, rocephin abx.    Labs Review Labs Reviewed  CBC WITH DIFFERENTIAL - Abnormal; Notable for the following:    RBC 2.64 (*)    Hemoglobin 7.1 (*)    HCT 22.5 (*)    RDW 26.6 (*)    Neutrophils Relative % 81 (*)    Lymphocytes Relative 10 (*)    Neutro Abs 8.3 (*)    All other components within normal limits  URINALYSIS, ROUTINE W REFLEX MICROSCOPIC - Abnormal; Notable for the following:    Color, Urine RED (*)    APPearance TURBID (*)    Hgb urine dipstick LARGE (*)    Bilirubin Urine LARGE (*)    Ketones, ur 40 (*)    Protein,  ur >300 (*)    Nitrite POSITIVE (*)    Leukocytes,  UA MODERATE (*)    All other components within normal limits  I-STAT CHEM 8, ED - Abnormal; Notable for the following:    BUN 44 (*)    Glucose, Bld 130 (*)    Calcium, Ion 1.34 (*)    Hemoglobin 7.8 (*)    HCT 23.0 (*)    All other components within normal limits  URINE CULTURE  URINE MICROSCOPIC-ADD ON  QUANTIFERON TB GOLD ASSAY  I-STAT CG4 LACTIC ACID, ED  POC OCCULT BLOOD, ED  POC OCCULT BLOOD, ED   Imaging Review Dg Chest 2 View  12/07/2013   CLINICAL DATA:  Failure to thrive  EXAM: CHEST  2 VIEW  COMPARISON:  DG CHEST 1V PORT dated 04/16/2013; CT CHEST W/CM dated 04/12/2013  FINDINGS: Heart size upper normal. Aortic arch calcification stable. There are numerous cavitary lesions bilaterally throughout both lungs. These are primarily thick-walled cavitary lesions measuring up to 3 cm. One lesion measuring about 2.7 cm seen in the retrocardiac area on the frontal view does not appear to be particularly cavitary but appears more solid.  IMPRESSION: As has been previously described, there are numerous round lesions throughout both lungs, the vast majority of which are cavitary, which could be due to infection such as septic emboli or tuberculosis. Metastatic disease can have this appearance. Rheumatoid nodules are another consideration, as are fungal infections.   Electronically Signed   By: Esperanza Heiraymond  Rubner M.D.   On: 12/07/2013 19:40     EKG Interpretation None      MDM   Final diagnoses:  UTI (lower urinary tract infection)  Failure to thrive  Anemia  Abnormal chest x-ray with multiple lung nodules    BP 151/68  Pulse 74  Temp(Src) 99.1 F (37.3 C) (Rectal)  Resp 18  SpO2 99%  I have reviewed nursing notes and vital signs. I personally reviewed the imaging tests through PACS system  I reviewed available ER/hospitalization records thought the EMR     Fayrene HelperBowie Tezra Mahr, [place] JerseyPA-C 12/07/13 2147

## 2013-12-07 NOTE — ED Provider Notes (Deleted)
Medical screening examination/treatment/procedure(s) were performed by non-physician practitioner and as supervising physician I was immediately available for consultation/collaboration.  Flint MelterElliott L Larue Drawdy, MD 12/07/13 [phone]

## 2013-12-07 NOTE — ED Notes (Signed)
Bed: [facility] Expected date:  Expected time:  Means of arrival:  Comments: EMS-FTT

## 2013-12-07 NOTE — H&P (Signed)
[facility] History and Physical  Jean Shaw ZOX:[id number] DOB: 1929-07-17 DOA: 12/07/2013  Referring physician: EDP PCP: Florentina Jenny, MD  Specialists:   Chief Complaint:  Not Eating and Drinking  HPI: Jean Shaw is a 80 y.o. female with a history of Alzheimer's Dementia, Legal Blindness, Deafness, and Recurrent UTIs who was brought to the ED due to lack of intake of foods and liquids over the past  3 days.  She has also had hematuria and has been passing large blood clots in her urine  For the past week.  The patient lives with her daughter who is her primary caretaker and the daughter gives the medical history.  Per the daughter, the patient had been called in Macrobid 50 mg  BID on 03/23 for a possible UTI to take for 7 days but her symptoms were not improving.   The patient had not been observed to have fevers or chills of nausea vomiting or diarrhea.           Review of Systems: Unable to Obtain from the Patient   Past Medical History  Diagnosis Date  . Glaucoma   . Hypertension   . Constipation   . Legally blind     "from glaucoma" (04/13/2013)  . History of blood transfusion 04/2013; 11/2013    "starts bleeding in urine"  . Arthritis     'generalized" (04/13/2013)  . Recurrent UTI     "been going on since 2011 thru this admission" (11/12/2013)  . Hearing loss of both ears     "no hearing out of right; won't wear aid in left" (04/13/2013)  . Alzheimer's dementia   . Kidney stone     "Dr. Nicholes Mango said she doesn't need it taken out; think it's been in since 2011"     Past Surgical History  Procedure Laterality Date  . Abdominal hysterectomy    . Cataract extraction, bilateral Bilateral       Prior to Admission medications   Medication Sig Start Date End Date Taking? Authorizing Provider  aspirin EC 81 MG tablet Take 1 tablet (81 mg total) by mouth daily. 11/14/13  Yes Adeline C Viyuoh, MD  brimonidine (ALPHAGAN) 0.15 % ophthalmic solution Place 1 drop into  both eyes 2 (two) times daily.   Yes Historical Provider, MD  CRANBERRY PO Take 600 mg by mouth 2 (two) times daily.   Yes Historical Provider, MD  docusate sodium 100 MG CAPS Take 100 mg by mouth 2 (two) times daily. 11/14/13  Yes Adeline C Viyuoh, MD  donepezil (ARICEPT) 10 MG tablet Take 10 mg by mouth at bedtime.   Yes Historical Provider, MD  ENSURE (ENSURE) Take 237 mLs by mouth See admin instructions. Takes 1 can two to three times daily, depending on appetite.   Yes Historical Provider, MD  losartan (COZAAR) 25 MG tablet Take 12.5 mg by mouth every morning.    Yes Historical Provider, MD  nitrofurantoin (MACRODANTIN) 100 MG capsule Take 100 mg by mouth 2 (two) times daily. To take 7 days- 11/30/13  Yes Historical Provider, MD  polyethylene glycol (MIRALAX / GLYCOLAX) packet Take 17 g by mouth every morning.    Yes Historical Provider, MD      No Known Allergies   Social History:   Bedbound, [employer], and [employer], Lives with Daughter and has some Home Health Care Assistance.     reports that she has quit smoking. Her smoking use included Cigarettes. She has a 50 pack-year smoking history.  She has never used smokeless tobacco. She reports that she drinks alcohol. She reports that she does not use illicit drugs.     History reviewed. No pertinent family history.     Physical Exam:  GEN:  Cachectic Contracted Frail Elderly 80 y.o. African American female  examined  and in no acute distress;  Filed Vitals:   12/07/13 2130 12/07/13 2200 12/07/13 2230 12/07/13 2300  BP: 150/65 157/55 150/56 160/49  Pulse:      Temp:      TempSrc:      Resp: 13 21 15 13   SpO2:       Blood pressure 160/49, pulse 74, temperature 99.1 F (37.3 C), temperature source Rectal, resp. rate 13, SpO2 99.00%. PSYCH: She is alert and oriented x 0;  HEENT: Normocephalic and Atraumatic, Mucous membranes pink; Opaque corneas Bilaterally Unable to visualize fundi;  No scleral icterus, Nares: Patent,  Oropharynx: Clear, Edentulous, Neck:  FROM, no cervical lymphadenopathy nor thyromegaly or carotid bruit; no JVD; Breasts:: Not examined CHEST WALL: No tenderness CHEST: Normal respiration, clear to auscultation bilaterally HEART: Regular rate and rhythm; no murmurs rubs or gallops BACK: No kyphosis or scoliosis; no CVA tenderness ABDOMEN: Positive Bowel Sounds, Scaphoid, soft non-tender; no masses, no organomegaly, Rectal Exam: Not done EXTREMITIES: No cyanosis, clubbing or edema; no ulcerations. Genitalia: not examined PULSES: 2+ and symmetric SKIN: Normal hydration no rash or ulceration CNS:  Generalized Weakness,  Alert X Oriented X 0,  Contracted X 4 Exts,  and  Bed-bound, Blind in Both Eyes, Unable to Hear and Cooperate Vascular: pulses palpable throughout     Labs on Admission:  Basic Metabolic Panel:  Recent Labs Lab 12/07/13 1858  NA 139  K 4.5  CL 104  GLUCOSE 130*  BUN 44*  CREATININE 0.90   Liver Function Tests: No results found for this basename: AST, ALT, ALKPHOS, BILITOT, PROT, ALBUMIN,  in the last 168 hours No results found for this basename: LIPASE, AMYLASE,  in the last 168 hours No results found for this basename: AMMONIA,  in the last 168 hours CBC:  Recent Labs Lab 12/07/13 1815 12/07/13 1858  WBC 10.2  --   NEUTROABS 8.3*  --   HGB 7.1* 7.8*  HCT 22.5* 23.0*  MCV 85.2  --   PLT 379  --    Cardiac Enzymes: No results found for this basename: CKTOTAL, CKMB, CKMBINDEX, TROPONINI,  in the last 168 hours  BNP (last 3 results) No results found for this basename: PROBNP,  in the last 8760 hours CBG: No results found for this basename: GLUCAP,  in the last 168 hours  Radiological Exams on Admission: Dg Chest 2 View  12/07/2013   CLINICAL DATA:  Failure to thrive  EXAM: CHEST  2 VIEW  COMPARISON:  DG CHEST 1V PORT dated 04/16/2013; CT CHEST W/CM dated 04/12/2013  FINDINGS: Heart size upper normal. Aortic arch calcification stable. There are numerous  cavitary lesions bilaterally throughout both lungs. These are primarily thick-walled cavitary lesions measuring up to 3 cm. One lesion measuring about 2.7 cm seen in the retrocardiac area on the frontal view does not appear to be particularly cavitary but appears more solid.  IMPRESSION: As has been previously described, there are numerous round lesions throughout both lungs, the vast majority of which are cavitary, which could be due to infection such as septic emboli or tuberculosis. Metastatic disease can have this appearance. Rheumatoid nodules are another consideration, as are fungal infections.   Electronically Signed  By: Esperanza Heiraymond  Rubner M.D.   On: 12/07/2013 19:40      Assessment/Plan:   80 y.o. female with  Principal Problem:   Failure to thrive Active Problems:   UTI (lower urinary tract infection)   Dementia   Hypertension   Hematuria   Anemia   Protein-calorie malnutrition, severe   Pulmonary nodules/lesions, multiple   Glaucoma   Blindness   Deafness     1.   FTT- IVFs, and continue Liquid diet,  Check Albumin and pre-Albumin levels. May Need addition of Megace Rx, However may improve with treatment of UTI.    Nutrition consult.    2.   UTI-  Urine C+S, and IV Rocephin, adjust PRN Culture Results.     3.   Dementia- chronic.     4.   HTN- IV Hydralazine PRN while not taking PO and BPs are > 160.     5.   Hematuria- due to #2. Monitor , should improve with Treatment.     6.   Anemia-  Normocytic Indices, Monitor H/Hs.      7.   Protein Calorie Malnutirition- chronic, See #1.    8.    Pulmonary Nodules-  Seen on CT scan in 04/2013 and had repeat Chest X-rays, Assumed to have had A TB Workup, Unable to find data, and Daughter is not aware, so placed in Airborne Precautions and a TB Quantiferon Test has been sent.   9.    Glaucoma/ and Blindness- continue Ophthalmic drops.    10.   Deafness- chronic.     11.   DVT prophylaxis with SCDs.       Code Status:   DO  NOT RESUSCITATE Family Communication:    Daughter at Bedside Disposition Plan:    Inpatient     Time spent:  260 Minutes  Ron ParkerJENKINS,Adelisa Satterwhite C [facility] Pager [phone]  If 7PM-7AM, please contact night-coverage [email or web address] Password [email or web address] 12/07/2013, 11:56 PM

## 2013-12-07 NOTE — ED Notes (Signed)
Per EMS. Pt from home. Hx dementia and blindness. Family told EMS pt has not eaten or taken meds since Sunday. Pt nonverbal and is at baseline per family. Pt has hx of persistent UTI.

## 2013-12-07 NOTE — ED Notes (Signed)
Attempted to call report. No answer.

## 2013-12-07 NOTE — ED Notes (Signed)
Bed: WA01 Expected date:  Expected time:  Means of arrival:  Comments: Marlynn PerkingHall E

## 2013-12-07 NOTE — ED Provider Notes (Signed)
  Face-to-face evaluation   History: Decreased oral intake due to suspected urinary tract infection. Patient's daughter manages her at her home. Patient's doctor prescribed Macrobid for a UTI, but it has  not helped. She has for UTIs.  Physical exam: Debilitated, elderly, female. Mucous membranes are dry. Abdomen soft.   Assessment: Urinary tract infection with dehydration.   Medical screening examination/treatment/procedure(s) were conducted as a shared visit with non-physician practitioner(s) and myself.  I personally evaluated the patient during the encounter  Flint MelterElliott L Betha Shadix, MD 12/07/13 [phone]

## 2013-12-07 NOTE — Progress Notes (Signed)
   CARE MANAGEMENT ED NOTE 12/07/2013  [facility]  [facility]   Account Number:  [id number]  Date Initiated:  12/07/2013  Documentation initiated by:  Radford PaxFERRERO,Valree Feild  Subjective/Objective Assessment:   patient presents to ED with decreased po intake and not taking her medications.     Subjective/Objective Assessment Detail:   Patient with pmhx of dementia, patient is blind     Action/Plan:   Action/Plan Detail:   Anticipated DC Date:       Status Recommendation to Physician:   Result of Recommendation:    Other ED Services  Consult Working Plan    DC Planning Services  Other    Choice offered to / List presented to:            Status of service:  Completed, signed off  ED Comments:   ED Comments Detail:  EDCM spoke to patient's daughter at bedside.  Patient lives at home with her daughter. Patient's daughter reports the patient is blind and "won't" talk.   Patient's daughter confirms patient's pcp is Dr. Sherilyn CooterHenry Trip with [facility], but is currently looking for a new pcp. Patient's daughter reports the patient is bed bound. Patient has a hospital bed at home, no other medical equipment per daughter.  Patient has private duty agency called [employer] coming to the patient's house Monday through Friday from 9am to 3pm as per daughter.  Patient is also a part of the [employer] program.  As per patient's daughter, [facility] when they are referred by Dr. Sherri Radrip.  Patient's daughter is very pleased with this home health agency. Patient's daughter also shared interest in home hospice. EDCM provided patient's daughter with [place], list of private duty nursing agencies, printed information about [facility] [place], and a [facility].  EDCM also provided patient's daughter with printed information for [facility] which is another pcp who will do home visits.   Patient's daughter reports she has spoken to the provider for [facility] and is very pleased with her and may switch to thhis provider.  Patient's daughter very thankful for resources.  No further EDCM needs at this time.

## 2013-12-08 ENCOUNTER — Encounter (HOSPITAL_COMMUNITY): Payer: Self-pay

## 2013-12-08 ENCOUNTER — Ambulatory Visit (HOSPITAL_COMMUNITY): Payer: Medicare Other

## 2013-12-08 DIAGNOSIS — Z9889 Other specified postprocedural states: Secondary | ICD-10-CM

## 2013-12-08 LAB — URINE CULTURE
COLONY COUNT: NO GROWTH
Culture: NO GROWTH

## 2013-12-08 LAB — HEMOGLOBIN AND HEMATOCRIT, BLOOD
HCT: 29.5 % — ABNORMAL LOW (ref 36.0–46.0)
HCT: 30 % — ABNORMAL LOW (ref 36.0–46.0)
HEMOGLOBIN: 9.6 g/dL — AB (ref 12.0–15.0)
Hemoglobin: 9.7 g/dL — ABNORMAL LOW (ref 12.0–15.0)

## 2013-12-08 LAB — CBC
HCT: 19.3 % — ABNORMAL LOW (ref 36.0–46.0)
Hemoglobin: 5.9 g/dL — CL (ref 12.0–15.0)
MCH: 26.8 pg (ref 26.0–34.0)
MCHC: 30.6 g/dL (ref 30.0–36.0)
MCV: 87.7 fL (ref 78.0–100.0)
Platelets: 290 10*3/uL (ref 150–400)
RBC: 2.2 MIL/uL — AB (ref 3.87–5.11)
RDW: 26.8 % — ABNORMAL HIGH (ref 11.5–15.5)
WBC: 10 10*3/uL (ref 4.0–10.5)

## 2013-12-08 LAB — BASIC METABOLIC PANEL WITH GFR
BUN: 29 mg/dL — ABNORMAL HIGH (ref 6–23)
CO2: 17 meq/L — ABNORMAL LOW (ref 19–32)
Calcium: 9.7 mg/dL (ref 8.4–10.5)
Chloride: 109 meq/L (ref 96–112)
Creatinine, Ser: 0.59 mg/dL (ref 0.50–1.10)
GFR calc Af Amer: >90 mL/min
GFR calc non Af Amer: 83 mL/min — ABNORMAL LOW
Glucose, Bld: 96 mg/dL (ref 70–99)
Potassium: 3.6 meq/L — ABNORMAL LOW (ref 3.7–5.3)
Sodium: 144 meq/L (ref 137–147)

## 2013-12-08 LAB — IRON AND TIBC
Iron: 34 ug/dL — ABNORMAL LOW (ref 42–135)
Saturation Ratios: 18 % — ABNORMAL LOW (ref 20–55)
TIBC: 189 ug/dL — ABNORMAL LOW (ref 250–470)
UIBC: 155 ug/dL (ref 125–400)

## 2013-12-08 LAB — PREPARE RBC (CROSSMATCH)

## 2013-12-08 LAB — ABO/RH: ABO/RH(D): A NEG

## 2013-12-08 MED ORDER — HYDROMORPHONE HCL PF 1 MG/ML IJ SOLN: 0.5000 mg | INTRAMUSCULAR | Status: DC | PRN

## 2013-12-08 MED ORDER — ACETAMINOPHEN 325 MG PO TABS: 650.0000 mg | ORAL_TABLET | Freq: Four times a day (QID) | ORAL | Status: DC | PRN

## 2013-12-08 MED ORDER — ENSURE COMPLETE PO LIQD
237.0000 mL | Freq: Three times a day (TID) | ORAL | Status: DC
  Administered 2013-12-08 – 2013-12-09 (×3): 237 mL via ORAL

## 2013-12-08 MED ORDER — ONDANSETRON HCL 4 MG PO TABS: 4.0000 mg | ORAL_TABLET | Freq: Four times a day (QID) | ORAL | Status: DC | PRN

## 2013-12-08 MED ORDER — DONEPEZIL HCL 10 MG PO TABS
10.0000 mg | ORAL_TABLET | Freq: Every day | ORAL | Status: DC
  Filled 2013-12-08 (×3): qty 1

## 2013-12-08 MED ORDER — ACETAMINOPHEN 650 MG RE SUPP: 650.0000 mg | Freq: Four times a day (QID) | RECTAL | Status: DC | PRN

## 2013-12-08 MED ORDER — ENSURE COMPLETE PO LIQD
237.0000 mL | Freq: Three times a day (TID) | ORAL | Status: DC
  Filled 2013-12-08 (×2): qty 237

## 2013-12-08 MED ORDER — ALUM & MAG HYDROXIDE-SIMETH 200-200-20 MG/5ML PO SUSP: 30.0000 mL | Freq: Four times a day (QID) | ORAL | Status: DC | PRN

## 2013-12-08 MED ORDER — IOHEXOL 300 MG/ML  SOLN
100.0000 mL | Freq: Once | INTRAMUSCULAR | Status: AC | PRN
  Administered 2013-12-08: 100 mL via INTRAVENOUS

## 2013-12-08 MED ORDER — ONDANSETRON HCL 4 MG/2ML IJ SOLN: 4.0000 mg | Freq: Four times a day (QID) | INTRAMUSCULAR | Status: DC | PRN

## 2013-12-08 MED ORDER — POLYETHYLENE GLYCOL 3350 17 G PO PACK
17.0000 g | PACK | Freq: Every morning | ORAL | Status: DC
  Filled 2013-12-08 (×2): qty 1

## 2013-12-08 MED ORDER — HYDRALAZINE HCL 20 MG/ML IJ SOLN
5.0000 mg | Freq: Four times a day (QID) | INTRAMUSCULAR | Status: DC | PRN
  Filled 2013-12-08: qty 0.25

## 2013-12-08 MED ORDER — OXYCODONE HCL 5 MG PO TABS: 5.0000 mg | ORAL_TABLET | ORAL | Status: DC | PRN

## 2013-12-08 MED ORDER — SODIUM CHLORIDE 0.9 % IV SOLN
INTRAVENOUS | Status: DC
  Administered 2013-12-08 (×2): via INTRAVENOUS

## 2013-12-08 MED ORDER — BRIMONIDINE TARTRATE 0.15 % OP SOLN
1.0000 [drp] | Freq: Two times a day (BID) | OPHTHALMIC | Status: DC
  Administered 2013-12-08 – 2013-12-09 (×4): 1 [drp] via OPHTHALMIC
  Filled 2013-12-08: qty 5

## 2013-12-08 NOTE — Progress Notes (Signed)
Thank you [facility] to meet your patient's and family's needs.   The reason that you asked us to see your patient is  For GOC  We have scheduled your patient for a meeting: 4/2 at 9 am  The Surrogate decision make is: Daughter , Earleen NewportMarie Parker   Contact information:[phone]  Other family members that need to be present: None    Your patient is able/unable to participate:dementia  Additional Narrative: daughter is primary care giver, was recently admitted for similar symptoms.

## 2013-12-08 NOTE — Consult Note (Signed)
Urology Consult   Physician requesting consult:V. Blake Divine  Reason for consult: Gross hematuria  History of Present Illness: Jean Shaw is a 80 y.o.  Daughter at bedside.Pt is a DNR with multiple co-morbidities: failure to thrive, dementia, blindness, and deaf. Unable to obtain any history from pt but daughter. She was last seen by Dr. Brunilda Payor (2011) for gross hematuria. Had cystoscopy and CT hematuria protocol at that time which showed no acute explanation for hematuria or pelvic mass on bimanual exam.  Since that time has had intermittent gross hematuria which NP at PCP (Dr.Tripp) has chronically treated as UTI's with multiple ABXs.  At this time pt has anemia (HGB: 5.9) and is receiving PRBCs.   She has had a recent UTI, developed while she was on nitrofurantoin prophylaxis with 100 mg of nitrofurantoin daily. Once she started developing signs of a recurrent UTI i.e. blood, she was put on 50 mg of nitrofurantoin twice daily. She has had progression of gross hematuria.  She denies a history of STDs, urolithiasis, GU malignancy/trauma/surgery.   Past Medical History  Diagnosis Date  . Glaucoma   . Hypertension   . Constipation   . Legally blind     "from glaucoma" (04/13/2013)  . History of blood transfusion 04/2013; 11/2013    "starts bleeding in urine"  . Arthritis     'generalized" (04/13/2013)  . Recurrent UTI     "been going on since 2011 thru this admission" (11/12/2013)  . Hearing loss of both ears     "no hearing out of right; won't wear aid in left" (04/13/2013)  . Alzheimer's dementia   . Kidney stone     "Dr. Nicholes Mango said she doesn't need it taken out; think it's been in since 2011"    Past Surgical History  Procedure Laterality Date  . Abdominal hysterectomy    . Cataract extraction, bilateral Bilateral      Current Hospital Medications:  Home meds:    Medication List    ASK your doctor about these medications       aspirin EC 81 MG tablet  Take 1 tablet (81 mg  total) by mouth daily.     brimonidine 0.15 % ophthalmic solution  Commonly known as:  ALPHAGAN  Place 1 drop into both eyes 2 (two) times daily.     CRANBERRY PO  Take 600 mg by mouth 2 (two) times daily.     donepezil 10 MG tablet  Commonly known as:  ARICEPT  Take 10 mg by mouth at bedtime.     DSS 100 MG Caps  Take 100 mg by mouth 2 (two) times daily.     ENSURE  Take 237 mLs by mouth See admin instructions. Takes 1 can two to three times daily, depending on appetite.     losartan 25 MG tablet  Commonly known as:  COZAAR  Take 12.5 mg by mouth every morning.     nitrofurantoin 100 MG capsule  Commonly known as:  MACRODANTIN  Take 100 mg by mouth 2 (two) times daily. To take 7 days-     polyethylene glycol packet  Commonly known as:  MIRALAX / GLYCOLAX  Take 17 g by mouth every morning.        Scheduled Meds: . brimonidine  1 drop Both Eyes BID  . cefTRIAXone (ROCEPHIN)  IV  1 g Intravenous Q24H  . donepezil  10 mg Oral QHS  . feeding supplement (ENSURE COMPLETE)  237 mL Oral TID BM  . polyethylene  glycol  17 g Oral q morning - 10a   Continuous Infusions: . sodium chloride 75 mL/hr at 12/08/13 0524   PRN Meds:.acetaminophen, acetaminophen, alum & mag hydroxide-simeth, hydrALAZINE, HYDROmorphone (DILAUDID) injection, ondansetron (ZOFRAN) IV, ondansetron, oxyCODONE  Allergies: No Known Allergies  History reviewed. No pertinent family history.  Social History:  reports that she has quit smoking. Her smoking use included Cigarettes. She has a 50 pack-year smoking history. She has never used smokeless tobacco. She reports that she drinks alcohol. She reports that she does not use illicit drugs.  ROS: A complete review of systems was performed.  All systems are negative except for pertinent findings as noted: hematuria.  Physical Exam:  Vital signs in last 24 hours: Temp:  [97.5 F (36.4 C)-99.1 F (37.3 C)] 98.7 F (37.1 C) (04/01 1200) Pulse Rate:  [65-81]  81 (04/01 1200) Resp:  [10-21] 20 (04/01 1200) BP: (124-160)/(49-86) 156/56 mmHg (04/01 1200) SpO2:  [98 %-100 %] 99 % (04/01 1200) Weight:  [38.6 kg (85 lb 1.6 oz)] 38.6 kg (85 lb 1.6 oz) (04/01 0018) Constitutional:  Alert. Non-verbal. No acute distress Cardiovascular: Regular rate and rhythm, No JVD Respiratory: Normal respiratory effort, Lungs clear bilaterally GI: Abdomen is soft, nontender, nondistended, no abdominal masses GU: No CVA tenderness. Foley draining grossly bloody urine. Lymphatic: No lymphadenopathy Neurologic: Non-verbal   Laboratory Data:   Recent Labs  12/07/13 1815 12/07/13 1858 12/08/13 0340  WBC 10.2  --  10.0  HGB 7.1* 7.8* 5.9*  HCT 22.5* 23.0* 19.3*  PLT 379  --  290     Recent Labs  12/07/13 1858 12/08/13 0340  NA 139 144  K 4.5 3.6*  CL 104 109  GLUCOSE 130* 96  BUN 44* 29*  CALCIUM  --  9.7  CREATININE 0.90 0.59     Results for orders placed during the hospital encounter of 12/07/13 (from the past 24 hour(s))  CBC WITH DIFFERENTIAL     Status: Abnormal   Collection Time    12/07/13  6:15 PM      Result Value Ref Range   WBC 10.2  4.0 - 10.5 K/uL   RBC 2.64 (*) 3.87 - 5.11 MIL/uL   Hemoglobin 7.1 (*) 12.0 - 15.0 g/dL   HCT 16.1 (*) 09.6 - 04.5 %   MCV 85.2  78.0 - 100.0 fL   MCH 26.9  26.0 - 34.0 pg   MCHC 31.6  30.0 - 36.0 g/dL   RDW 40.9 (*) 81.1 - 91.4 %   Platelets 379  150 - 400 K/uL   Neutrophils Relative % 81 (*) 43 - 77 %   Lymphocytes Relative 10 (*) 12 - 46 %   Monocytes Relative 9  3 - 12 %   Eosinophils Relative 0  0 - 5 %   Basophils Relative 0  0 - 1 %   Neutro Abs 8.3 (*) 1.7 - 7.7 K/uL   Lymphs Abs 1.0  0.7 - 4.0 K/uL   Monocytes Absolute 0.9  0.1 - 1.0 K/uL   Eosinophils Absolute 0.0  0.0 - 0.7 K/uL   Basophils Absolute 0.0  0.0 - 0.1 K/uL   RBC Morphology POLYCHROMASIA PRESENT    I-STAT CHEM 8, ED     Status: Abnormal   Collection Time    12/07/13  6:58 PM      Result Value Ref Range   Sodium 139   137 - 147 mEq/L   Potassium 4.5  3.7 - 5.3 mEq/L  Chloride 104  96 - 112 mEq/L   BUN 44 (*) 6 - 23 mg/dL   Creatinine, Ser 1.470.90  0.50 - 1.10 mg/dL   Glucose, Bld 829130 (*) 70 - 99 mg/dL   Calcium, Ion 5.621.34 (*) 1.13 - 1.30 mmol/L   TCO2 21  0 - 100 mmol/L   Hemoglobin 7.8 (*) 12.0 - 15.0 g/dL   HCT 13.023.0 (*) 86.536.0 - 78.446.0 %  I-STAT CG4 LACTIC ACID, ED     Status: None   Collection Time    12/07/13  6:59 PM      Result Value Ref Range   Lactic Acid, Venous 2.09  0.5 - 2.2 mmol/L  URINALYSIS, ROUTINE W REFLEX MICROSCOPIC     Status: Abnormal   Collection Time    12/07/13  7:12 PM      Result Value Ref Range   Color, Urine RED (*) YELLOW   APPearance TURBID (*) CLEAR   Specific Gravity, Urine 1.027  1.005 - 1.030   pH 5.0  5.0 - 8.0   Glucose, UA NEGATIVE  NEGATIVE mg/dL   Hgb urine dipstick LARGE (*) NEGATIVE   Bilirubin Urine LARGE (*) NEGATIVE   Ketones, ur 40 (*) NEGATIVE mg/dL   Protein, ur >696>300 (*) NEGATIVE mg/dL   Urobilinogen, UA 1.0  0.0 - 1.0 mg/dL   Nitrite POSITIVE (*) NEGATIVE   Leukocytes, UA MODERATE (*) NEGATIVE  URINE MICROSCOPIC-ADD ON     Status: None   Collection Time    12/07/13  7:12 PM      Result Value Ref Range   RBC / HPF TOO NUMEROUS TO COUNT  <3 RBC/hpf   Urine-Other URINALYSIS PERFORMED ON SUPERNATANT    POC OCCULT BLOOD, ED     Status: None   Collection Time    12/07/13  9:02 PM      Result Value Ref Range   Fecal Occult Bld NEGATIVE  NEGATIVE  BASIC METABOLIC PANEL     Status: Abnormal   Collection Time    12/08/13  3:40 AM      Result Value Ref Range   Sodium 144  137 - 147 mEq/L   Potassium 3.6 (*) 3.7 - 5.3 mEq/L   Chloride 109  96 - 112 mEq/L   CO2 17 (*) 19 - 32 mEq/L   Glucose, Bld 96  70 - 99 mg/dL   BUN 29 (*) 6 - 23 mg/dL   Creatinine, Ser 2.950.59  0.50 - 1.10 mg/dL   Calcium 9.7  8.4 - 28.410.5 mg/dL   GFR calc non Af Amer 83 (*) >90 mL/min   GFR calc Af Amer >90  >90 mL/min  CBC     Status: Abnormal   Collection Time    12/08/13   3:40 AM      Result Value Ref Range   WBC 10.0  4.0 - 10.5 K/uL   RBC 2.20 (*) 3.87 - 5.11 MIL/uL   Hemoglobin 5.9 (*) 12.0 - 15.0 g/dL   HCT 13.219.3 (*) 44.036.0 - 10.246.0 %   MCV 87.7  78.0 - 100.0 fL   MCH 26.8  26.0 - 34.0 pg   MCHC 30.6  30.0 - 36.0 g/dL   RDW 72.526.8 (*) 36.611.5 - 44.015.5 %   Platelets 290  150 - 400 K/uL  PREPARE RBC (CROSSMATCH)     Status: None   Collection Time    12/08/13  5:00 AM      Result Value Ref Range   Order Confirmation  ORDER PROCESSED BY BLOOD BANK    TYPE AND SCREEN     Status: None   Collection Time    12/08/13  5:20 AM      Result Value Ref Range   ABO/RH(D) A NEG     Antibody Screen NEG     Sample Expiration 12/11/2013     Unit Number [id number]     Blood Component Type RED CELLS,LR     Unit division 00     Status of Unit ISSUED     Transfusion Status OK TO TRANSFUSE     Crossmatch Result Compatible     Unit Number [id number]     Blood Component Type RED CELLS,LR     Unit division 00     Status of Unit ISSUED     Transfusion Status OK TO TRANSFUSE     Crossmatch Result Compatible    IRON AND TIBC     Status: Abnormal   Collection Time    12/08/13  5:20 AM      Result Value Ref Range   Iron 34 (*) 42 - 135 ug/dL   TIBC 025 (*) 852 - 778 ug/dL   Saturation Ratios 18 (*) 20 - 55 %   UIBC 155  125 - 400 ug/dL  ABO/RH     Status: None   Collection Time    12/08/13  5:20 AM      Result Value Ref Range   ABO/RH(D) A NEG     No results found for this or any previous visit (from the past 240 hour(s)).  Renal Function:  Recent Labs  12/07/13 1858 12/08/13 0340  CREATININE 0.90 0.59   Estimated Creatinine Clearance: 33 ml/min (by C-G formula based on Cr of 0.59).  Radiologic Imaging: Dg Chest 2 View  12/07/2013   CLINICAL DATA:  Failure to thrive  EXAM: CHEST  2 VIEW  COMPARISON:  DG CHEST 1V PORT dated 04/16/2013; CT CHEST W/CM dated 04/12/2013  FINDINGS: Heart size upper normal. Aortic arch calcification stable. There are numerous  cavitary lesions bilaterally throughout both lungs. These are primarily thick-walled cavitary lesions measuring up to 3 cm. One lesion measuring about 2.7 cm seen in the retrocardiac area on the frontal view does not appear to be particularly cavitary but appears more solid.  IMPRESSION: As has been previously described, there are numerous round lesions throughout both lungs, the vast majority of which are cavitary, which could be due to infection such as septic emboli or tuberculosis. Metastatic disease can have this appearance. Rheumatoid nodules are another consideration, as are fungal infections.   Electronically Signed   By: Esperanza Heir M.D.   On: 12/07/2013 19:40    I independently reviewed the above imaging studies.  Impression/Recommendation: 1- Hand irrigate foley prn clots-at this alone does not work, would increase in size of the catheter 2- CT hematuria protocol. 4/1/5 creatine: 0.59 3- Discussed with Dr. Rica Mote with DNR status and multiple co-morbidities recommend non-aggressive approach at this time. May need cystoscopy if acute findings seen on CT but at this time will be difficult to assess cystoscopically due to grossly bloody urine.  4- Recommend holding ASA till urine clears 5- Severe anemia. Hbg has trended down to 5.9. Continue with blood transfusions as per medical  [facility] 12/08/2013, 1:05 PM    I examined the patient, interviewed the patient's daughter. At this point, I don't think aggressive intervention i.e. further diagnostic testing is necessary. I agree with broad-spectrum antibiotic management. Hopefully, once the  possible infection is adequately treated bleeding will decrease. CC: Dr. Redmond School Dr. Thurman Coyer Dr. Lenoria Chime

## 2013-12-08 NOTE — Progress Notes (Signed)
CRITICAL VALUE ALERT  Critical value received:  Hemoglobin 5.9  Date of notification:  4/1  Time of notification:  0407  Critical value read back:yes  Nurse who received alert:  Karie SchwalbeJoanne Mikayela Deats  MD notified (1st page):  Yes  Time of first page:  [phone]

## 2013-12-08 NOTE — Progress Notes (Signed)
12/08/13 1700  Clinical Encounter Type  Visited With Patient not available  Referral From Palliative care team   Attempted visit per Palliative Care Rounding, but per RN pt sleeping and no family present.  Please page Spiritual Care as needed for pt/family.  Thank you.  [address]Chaplain Lennon Richins ScottLundeen, [place] [phone]

## 2013-12-08 NOTE — Evaluation (Signed)
Clinical/Bedside Swallow Evaluation Patient Details  Name: Jean Shaw MRN: [id number] Date of Birth: [birth year]  Today's Date: 12/08/2013 Time: [id number] SLP Time Calculation (min): 32 min  Past Medical History:  Past Medical History  Diagnosis Date  . Glaucoma   . Hypertension   . Constipation   . Legally blind     "from glaucoma" (04/13/2013)  . History of blood transfusion 04/2013; 11/2013    "starts bleeding in urine"  . Arthritis     'generalized" (04/13/2013)  . Recurrent UTI     "been going on since 2011 thru this admission" (11/12/2013)  . Hearing loss of both ears     "no hearing out of right; won't wear aid in left" (04/13/2013)  . Alzheimer's dementia   . Kidney stone     "Dr. Nicholes MangoNissi said she doesn't need it taken out; think it's been in since 2011"   Past Surgical History:  Past Surgical History  Procedure Laterality Date  . Abdominal hysterectomy    . Cataract extraction, bilateral Bilateral    HPI:  80 [facility], Pt PMH + for dementia, UTI, glaucoma, deafness, HTN.  Pt with ? TB or embolus per imaging study, daughter declined w/u per MD note.  Bedside swallow evaluation ordered by MD.     Assessment / Plan / Recommendation Clinical Impression  Pt presents with cognitive based oral dysphagia at baseline that is likely exacerbated due to current medical condition.  Pt with lingual thrusting resulting in anterior loss of portion of icecream boluses fed by daughter.  Pt accepted a few boluses of Ensure via straw - Delayed oral transiting due to weakness and poor oral coordination with suspected premature spillage into pharynx.  Subtle cough x2 of 10 Ensure boluses- advised daughter to allow pt to rest at that point due to concern for possible aspiration/stasis.   Daughter states pt coughs at home to get attention, advised her to cough with intake also possibly indicating aspiration/airway compromise.    Daughter reports pt requires one hour to eat a  meal at home - and was accepting intake well until this last Sunday when she became ill.  Advised daughter to follow pt's lead and offer her intake - but stopping if pt not accepting or coughing.  Further educated daughter to assess for laryngeal elevation to assure pt swallows.   Jean Shaw (dtr) has been taking care of pt at home since 2007 and appears to manage her mother's dysphagia well.    Hopeful for intakeand swallow ability to improve with medical improvement but anticipate long term dysphagia progressive due to pt's dementia.  SLP to follow up x1 for education only.      Aspiration Risk    Moderate and ongoing   Diet Recommendation Dysphagia 1 (Puree);Thin liquid   Liquid Administration via: Straw;Spoon Medication Administration: Crushed with puree Supervision: Full supervision/cueing for compensatory strategies;Trained caregiver to feed patient Compensations: Slow rate;Small sips/bites (clean mouth after meals to assure no food residuals present) Postural Changes and/or Swallow Maneuvers: Upright 30-60 min after meal;Seated upright 90 degrees    Other  Recommendations Oral Care Recommendations: Oral care before and after PO   Follow Up Recommendations       Frequency and Duration min 1 x/week  1 week   Pertinent Vitals/Pain Afebrile, decreased      Swallow Study Prior Functional Status   weight loss recently per daughter, pt taken care of by daughter and caregivers at home, on puree/thin diet  General Date of Onset: 12/08/13 HPI: 80 yo female adm to [facility] with FTT, Pt PMH + for dementia, UTI, glaucoma, deafness, HTN.  Pt with ? TB or embolus per imaging study, daughter declined w/u per MD note.  Bedside swallow evaluation ordered by MD.   Type of Study: Bedside swallow evaluation Diet Prior to this Study: Dysphagia 1 (puree);Thin liquids Temperature Spikes Noted: No Respiratory Status: Room air History of Recent Intubation: No Behavior/Cognition: Alert;Doesn't follow  directions;Decreased sustained attention;Distractible Oral Cavity - Dentition:  (few teeth, no dentures, pt on puree diet prior to admission) Self-Feeding Abilities: Total assist Patient Positioning: Upright in bed Baseline Vocal Quality: Clear Volitional Cough: Cognitively unable to elicit Volitional Swallow: Unable to elicit    Oral/Motor/Sensory Function Overall Oral Motor/Sensory Function:  (pt did not follow directions, no focal deficits observed,excessive lingual movement noted - even without po intake)   Ice Chips Ice chips: Not tested   Thin Liquid Thin Liquid: Not tested Other Comments: pt accepted Ensure and icecream only    Nectar Thick Nectar Thick Liquid: Impaired Presentation: Straw Oral Phase Impairments: Reduced labial seal;Reduced lingual movement/coordination;Impaired anterior to posterior transit Oral phase functional implications: Prolonged oral transit;Oral holding;Right anterior spillage Pharyngeal Phase Impairments: Suspected delayed Swallow;Cough - Delayed Other Comments: cough delayed x2 of approx 10 boluses- after consumption of one ounce icecream and 2 ounces Ensure , advised daughter to allow pt to rest due to possible aspiration and pharyngeal stasis - pt with lingual thrusting observed   Honey Thick Honey Thick Liquid: Not tested   Puree Puree: Impaired Presentation: Spoon Oral Phase Impairments: Reduced labial seal;Reduced lingual movement/coordination;Impaired anterior to posterior transit Oral Phase Functional Implications: Prolonged oral transit;Oral holding Other Comments: icecream:   pt with lingual thrusting observed, anterior spillage due to lingual thrust, no s/s of aspiration   Solid   GO    Solid: Not tested Other Comments: pt's level of oral dysphagia prohibitive to attempt solids       Mills Koller, MS [facility] SLP [phone]

## 2013-12-08 NOTE — Progress Notes (Signed)
RN was on hold for over 15 minutes trying to get report from ED at 2305-2320 on 12/07/13.  [place] [facility] [place] [facility] [place] at 0015.

## 2013-12-08 NOTE — Progress Notes (Signed)
[facility] PROGRESS NOTE  Jean Shaw ZOX:[id number] DOB: Apr 02, 1929 DOA: 12/07/2013 PCP: Jean Jenny, MD  Assessment/Plan: 1. fAILURE to thrive: - a combination of dementia and multiple medical problems.  - nutrition consulted.  - swallow eval.  - palliative care consult for goals of care.  - as per her daughter , she has stopped eating from Sunday and has decreased po intake care for since 2 weeks.   2. Hematuria:  - for 2 weeks as per her daughter.  - on macrobid for recent UTI.  - her urologist is Dr Jean Shaw.  - requested urology for further recommendations.   3. Anemia / hgb of 5.9/ anemia of blood loss from hematuria and anemia of chronic disease: - transfuse to keep hemoglobin >7.  - anemia panel sent and pending.  - stool for occult blood pending.   4. Dementia: - without behavioural symptoms.   5. UTI: - ON ROCEPHIN.   6. Pulmonary nodules:  - pt daughter is aware of the nodules and was offered further work up in august 2014 and she refused further workup and continues to do it.   7. Hypertension:       Code Status: DNR Family Communication: discussed with daughter on the phone.  Disposition Plan: pending further evaluation.   Consultants:  Urology   Palliative care consult.   Procedures:  none  Antibiotics:  Rocephin 3/31  HPI/Subjective: Confused,   Objective: Filed Vitals:   12/08/13 0900  BP:   Pulse: 71  Temp: 98 F (36.7 C)  Resp: 18    Intake/Output Summary (Last 24 hours) at 12/08/13 1028 Last data filed at 12/08/13 0800  Gross per 24 hour  Intake  892.5 ml  Output    150 ml  Net  742.5 ml   Filed Weights   12/08/13 0018  Weight: 38.6 kg (85 lb 1.6 oz)    Exam:   General:  AROUSABLE, afebrile comfortable  Cardiovascular: s1s2  Respiratory: SCATTERED RHONCHI AT BASES.  Abdomen: soft NT ND BS+, scaphoid  Musculoskeletal: no pedal edema.  Data Reviewed: Basic Metabolic Panel:  Recent  Labs Lab 12/07/13 1858 12/08/13 0340  NA 139 144  K 4.5 3.6*  CL 104 109  CO2  --  17*  GLUCOSE 130* 96  BUN 44* 29*  CREATININE 0.90 0.59  CALCIUM  --  9.7   Liver Function Tests: No results found for this basename: AST, ALT, ALKPHOS, BILITOT, PROT, ALBUMIN,  in the last 168 hours No results found for this basename: LIPASE, AMYLASE,  in the last 168 hours No results found for this basename: AMMONIA,  in the last 168 hours CBC:  Recent Labs Lab 12/07/13 1815 12/07/13 1858 12/08/13 0340  WBC 10.2  --  10.0  NEUTROABS 8.3*  --   --   HGB 7.1* 7.8* 5.9*  HCT 22.5* 23.0* 19.3*  MCV 85.2  --  87.7  PLT 379  --  290   Cardiac Enzymes: No results found for this basename: CKTOTAL, CKMB, CKMBINDEX, TROPONINI,  in the last 168 hours BNP (last 3 results) No results found for this basename: PROBNP,  in the last 8760 hours CBG: No results found for this basename: GLUCAP,  in the last 168 hours  No results found for this or any previous visit (from the past 240 hour(s)).   Studies: Dg Chest 2 View  12/07/2013   CLINICAL DATA:  Failure to thrive  EXAM: CHEST  2 VIEW  COMPARISON:  DG CHEST 1V  PORT dated 04/16/2013; CT CHEST W/CM dated 04/12/2013  FINDINGS: Heart size upper normal. Aortic arch calcification stable. There are numerous cavitary lesions bilaterally throughout both lungs. These are primarily thick-walled cavitary lesions measuring up to 3 cm. One lesion measuring about 2.7 cm seen in the retrocardiac area on the frontal view does not appear to be particularly cavitary but appears more solid.  IMPRESSION: As has been previously described, there are numerous round lesions throughout both lungs, the vast majority of which are cavitary, which could be due to infection such as septic emboli or tuberculosis. Metastatic disease can have this appearance. Rheumatoid nodules are another consideration, as are fungal infections.   Electronically Signed   By: Jean Shaw  Jean Shaw M.D.   On:  12/07/2013 19:40    Scheduled Meds: . brimonidine  1 drop Both Eyes BID  . cefTRIAXone (ROCEPHIN)  IV  1 g Intravenous Q24H  . donepezil  10 mg Oral QHS  . feeding supplement (ENSURE COMPLETE)  237 mL Oral TID BM  . polyethylene glycol  17 g Oral q morning - 10a   Continuous Infusions: . sodium chloride 75 mL/hr at 12/08/13 [id number]    Principal Problem:   Failure to thrive Active Problems:   Dementia   Hypertension   Hematuria   Anemia   UTI (lower urinary tract infection)   Protein-calorie malnutrition, severe   Pulmonary nodules/lesions, multiple   Glaucoma   Blindness   Deafness    Time spent: 35 MIN    Jean Shaw  [facility] Pager [phone]. If 7PM-7AM, please contact night-coverage at [email or web address], password [email or web address] 12/08/2013, 10:28 AM  LOS: 1 day

## 2013-12-08 NOTE — Plan of Care (Signed)
Problem: Phase II Progression Outcomes Goal: Progress activity as tolerated unless otherwise ordered Outcome: Not Met (add Reason) Pt has contractures  Problem: Phase III Progression Outcomes Goal: Activity at appropriate level-compared to baseline (UP IN CHAIR FOR HEMODIALYSIS)  Outcome: Not Met (add Reason) Pt has contractures

## 2013-12-09 DIAGNOSIS — R52 Pain, unspecified: Secondary | ICD-10-CM

## 2013-12-09 DIAGNOSIS — F0390 Unspecified dementia without behavioral disturbance: Secondary | ICD-10-CM

## 2013-12-09 DIAGNOSIS — Z515 Encounter for palliative care: Secondary | ICD-10-CM

## 2013-12-09 DIAGNOSIS — R531 Weakness: Secondary | ICD-10-CM

## 2013-12-09 LAB — TYPE AND SCREEN
ABO/RH(D): A NEG
Antibody Screen: NEGATIVE
Unit division: 0
Unit division: 0

## 2013-12-09 LAB — BASIC METABOLIC PANEL
BUN: 11 mg/dL (ref 6–23)
CHLORIDE: 111 meq/L (ref 96–112)
CO2: 22 meq/L (ref 19–32)
CREATININE: 0.51 mg/dL (ref 0.50–1.10)
Calcium: 9 mg/dL (ref 8.4–10.5)
GFR calc Af Amer: >90 mL/min (ref >90)
GFR calc non Af Amer: 87 mL/min — ABNORMAL LOW (ref >90)
Glucose, Bld: 98 mg/dL (ref 70–99)
Potassium: 2.7 mEq/L — CL (ref 3.7–5.3)
SODIUM: 144 meq/L (ref 137–147)

## 2013-12-09 LAB — QUANTIFERON TB GOLD ASSAY (BLOOD)
MITOGEN VALUE: 0.23 [IU]/mL
QUANTIFERON NIL VALUE: 0.01 [IU]/mL
TB AG VALUE: 0.01 [IU]/mL
TB ANTIGEN MINUS NIL VALUE: 0 [IU]/mL

## 2013-12-09 LAB — HEMOGLOBIN AND HEMATOCRIT, BLOOD
HEMATOCRIT: 27.7 % — AB (ref 36.0–46.0)
HEMOGLOBIN: 9.2 g/dL — AB (ref 12.0–15.0)

## 2013-12-09 LAB — MAGNESIUM: Magnesium: 1.8 mg/dL (ref 1.5–2.5)

## 2013-12-09 MED ORDER — MORPHINE SULFATE (CONCENTRATE) 10 MG /0.5 ML PO SOLN: 5.0000 mg | ORAL | Status: DC | PRN

## 2013-12-09 MED ORDER — MORPHINE SULFATE (CONCENTRATE) 10 MG /0.5 ML PO SOLN: 5.0000 mg | ORAL | Status: AC | PRN

## 2013-12-09 MED ORDER — POTASSIUM CHLORIDE 10 MEQ/100ML IV SOLN
10.0000 meq | INTRAVENOUS | Status: AC
  Administered 2013-12-09 (×4): 10 meq via INTRAVENOUS
  Filled 2013-12-09 (×4): qty 100

## 2013-12-09 NOTE — Progress Notes (Signed)
SLP noted pt to dc home with hospice, will sign off, please reorder if desire. Jean Burnetamara [name], MS [facility] SLP [phone]

## 2013-12-09 NOTE — Consult Note (Signed)
Patient Jean Shaw      DOB: 1928-09-21      JWJ:[id number]     Consult Note from the Palliative Medicine Team at [facility]    Consult Requested by: Dr  Blake Divine     PCP: Florentina Jenny, MD Reason for Consultation: Clarification of GOC and options     Phone [phone]  Assessment of patients Current state: Jean Shaw is a 80 y.o. female with a history of Alzheimer's Dementia, Legal Blindness, Deafness, and Recurrent UTIs who was brought to the ED due to lack of intake of foods and liquids over the past 3 days. She has also had hematuria and has been passing large blood clots in her urine For the past week. The patient lives with her daughter who is her primary caretaker and the daughter gives the medical history. Per the daughter, the patient had been called in Macrobid 50 mg BID on 03/23 for a possible UTI to take for 7 days but her symptoms were not improving. Her PCP is Dr Tripp's in home service.  Family reports significant physical, functional, and cognitive decline over the past six months Focus is comfort and to get back home   This NP Lorinda Creed reviewed medical records, received report from team, assessed the patient and then meet at the patient's bedside along with her daughter Hilda Lias  to discuss diagnosis prognosis, GOC, EOL wishes disposition and options.  A detailed discussion was had today regarding advanced directives.  Concepts specific to code status, artifical feeding and hydration, continued IV antibiotics and rehospitalization was had.  The difference between a aggressive medical intervention path  and a palliative comfort care path for this patient at this time was had.  Values and goals of care important to patient and family were attempted to be elicited.  Concept of Hospice and Palliative Care were discussed  Natural trajectory and expectations at EOL were discussed.  Questions and concerns addressed.  Hard Choices/Gone from my Sight booklet  left for review. Family encouraged to call with questions or concerns.  PMT will continue to support holistically.   Goals of Care: 1.  Code Status:DNR/DNI-comfort is main focus of care   2. Scope of Treatment  Focus of care is comfort.  Daughter is hopeful to get her mother home, "to die at home".  She understands her prognosis is limited, she is open to hospice services.  -no further rehospitalization -no further artifical hydration -symptom managment   MOST form completed    3. Disposition:  Home with hospice, hopefully today   4. Symptom Management:   1. Pain/Dyspnea: Roxanol 5 mg po every 2 hrs prn 2.  Weakness/Adult failure to thrive-comfort measures   5. Psychosocial:  Emotional support offered to daughter, she expresses understanding of her poor prognosis.  She hopes only for comfort and dignity at this time.   Patient Documents Completed or Given: Document Given Completed  Advanced Directives Pkt    MOST  yes  DNR    Gone from My Sight  yes  Hard Choices  yes    Brief ION:GEXBMWUXL [name] is a 80 y.o. female with a history of Alzheimer's Dementia, Legal Blindness, Deafness, and Recurrent UTIs who was brought to the ED due to lack of intake of foods and liquids over the past 3 days. She has also had hematuria and has been passing large blood clots in her urine For the past week. The patient lives with her daughter who is her primary caretaker and the daughter  gives the medical history. Per the daughter, the patient had been called in Macrobid 50 mg BID on 03/23 for a possible UTI to take for 7 days but her symptoms were not improving. The patient had not been observed to have fevers or chills of nausea vomiting or diarrhea.     ROS: unable to illicit   PMH:  Past Medical History  Diagnosis Date  . Glaucoma   . Hypertension   . Constipation   . Legally blind     "from glaucoma" (04/13/2013)  . History of blood transfusion 04/2013; 11/2013    "starts  bleeding in urine"  . Arthritis     'generalized" (04/13/2013)  . Recurrent UTI     "been going on since 2011 thru this admission" (11/12/2013)  . Hearing loss of both ears     "no hearing out of right; won't wear aid in left" (04/13/2013)  . Alzheimer's dementia   . Kidney stone     "Dr. Nicholes Mango said she doesn't need it taken out; think it's been in since 2011"     PSH: Past Surgical History  Procedure Laterality Date  . Abdominal hysterectomy    . Cataract extraction, bilateral Bilateral    I have reviewed the FH and SH and  If appropriate update it with new information. No Known Allergies Scheduled Meds: . brimonidine  1 drop Both Eyes BID  . cefTRIAXone (ROCEPHIN)  IV  1 g Intravenous Q24H  . donepezil  10 mg Oral QHS  . feeding supplement (ENSURE COMPLETE)  237 mL Oral TID BM  . polyethylene glycol  17 g Oral q morning - 10a  . potassium chloride  10 mEq Intravenous Q1 Hr x 4   Continuous Infusions: . sodium chloride 75 mL/hr at 12/08/13 0524   PRN Meds:.acetaminophen, acetaminophen, alum & mag hydroxide-simeth, hydrALAZINE, HYDROmorphone (DILAUDID) injection, morphine CONCENTRATE, ondansetron (ZOFRAN) IV, ondansetron, oxyCODONE    BP 166/58  Pulse 70  Temp(Src) 98 F (36.7 C) (Axillary)  Resp 20  Ht 5\' 4"  (1.626 m)  Wt 38.6 kg (85 lb 1.6 oz)  BMI 14.60 kg/m2  SpO2 100%   PPS: 20 % at best   Intake/Output Summary (Last 24 hours) at 12/09/13 0927 Last data filed at 12/09/13 0600  Gross per 24 hour  Intake 2022.5 ml  Output    650 ml  Net 1372.5 ml    Physical Exam:  General: ill appearing, NAD, frail cachetic  HEENT:  + temporal muscle wasting, MM no exudate Chest:   Decreased in bases, CTA CVS: RRR Abdomen:soft NT Ext: contracted BLE, no edema Neuro:  Unable to follow commands  Labs: CBC    Component Value Date/Time   WBC 10.0 12/08/2013 0340   RBC 2.20* 12/08/2013 0340   RBC 2.91* 11/11/2013 1739   HGB 9.2* 12/09/2013 0600   HCT 27.7* 12/09/2013 0600    PLT 290 12/08/2013 0340   MCV 87.7 12/08/2013 0340   MCH 26.8 12/08/2013 0340   MCHC 30.6 12/08/2013 0340   RDW 26.8* 12/08/2013 0340   LYMPHSABS 1.0 12/07/2013 1815   MONOABS 0.9 12/07/2013 1815   EOSABS 0.0 12/07/2013 1815   BASOSABS 0.0 12/07/2013 1815    BMET    Component Value Date/Time   NA 144 12/09/2013 0600   K 2.7* 12/09/2013 0600   CL 111 12/09/2013 0600   CO2 22 12/09/2013 0600   GLUCOSE 98 12/09/2013 0600   BUN 11 12/09/2013 0600   CREATININE 0.51 12/09/2013 0600  CALCIUM 9.0 12/09/2013 0600   GFRNONAA 87* 12/09/2013 0600   GFRAA >90 12/09/2013 0600    CMP     Component Value Date/Time   NA 144 12/09/2013 0600   K 2.7* 12/09/2013 0600   CL 111 12/09/2013 0600   CO2 22 12/09/2013 0600   GLUCOSE 98 12/09/2013 0600   BUN 11 12/09/2013 0600   CREATININE 0.51 12/09/2013 0600   CALCIUM 9.0 12/09/2013 0600   PROT 7.3 11/12/2013 0700   ALBUMIN 2.4* 11/12/2013 0700   AST 17 11/12/2013 0700   ALT 6 11/12/2013 0700   ALKPHOS 65 11/12/2013 0700   BILITOT 0.6 11/12/2013 0700   GFRNONAA 87* 12/09/2013 0600   GFRAA >90 12/09/2013 0600     Time In Time Out Total Time Spent with Patient Total Overall Time  0845 1000 70 min 75 min    Greater than 50%  of this time was spent counseling and coordinating care related to the above assessment and plan.   Lorinda CreedMary Denora Wysocki NP  Palliative Medicine Team Team Phone # [phone] Pager [phone]  Discussed with Dr Blake DivineAkula

## 2013-12-09 NOTE — Progress Notes (Signed)
Spoke with pt's daughter at bedside to offer hospice choices, [facility]. Referral called to Delray AltMargie, RN with [facility].

## 2013-12-09 NOTE — Discharge Summary (Signed)
Physician Discharge Summary  Jean Buttslizabeth Sarwar WUJ:[id number] DOB: [birth year] DOA: 12/07/2013  PCP: Florentina JennyRIPP, HENRY, MD  Admit date: 12/07/2013 Discharge date: 12/09/2013  Time spent: 30 minutes  Recommendations for Outpatient Follow-up:  1. Follow up with pCP as needed.   Discharge Diagnoses:  Principal Problem:   Failure to thrive Active Problems:   Dementia   Hypertension   Hematuria   Anemia   UTI (lower urinary tract infection)   Protein-calorie malnutrition, severe   Pulmonary nodules/lesions, multiple   Glaucoma   Blindness   Deafness   History of cystoscopy   Discharge Condition: improved.  Diet recommendation: comfort feeding.   Filed Weights   12/08/13 0018  Weight: 38.6 kg (85 lb 1.6 oz)    History of present illness:  Jean Shaw is a 80 y.o. female with a history of Alzheimer's Dementia, Legal Blindness, Deafness, and Recurrent UTIs who was brought to the ED due to lack of intake of foods and liquids over the past 3 days. She has also had hematuria and has been passing large blood clots in her urine For the past week. The patient lives with her daughter who is her primary caretaker and the daughter gives the medical history. Per the daughter, the patient had been called in Macrobid 50 mg BID on 03/23 for a possible UTI to take for 7 days but her symptoms were not improving. The patient had not been observed to have fevers or chills of nausea vomiting or diarrhea   Hospital Course:  1. FAILURE to thrive: - a combination of dementia and multiple medical problems.  - nutrition consulted and swallow evaluation ordered.  - palliative care consult for goals of care.  - as per her daughter , she has stopped eating from Sunday and has decreased po intake care for since 2 weeks. They had a meeting and patient's daughter wanted her to come home with home hospice. She does not want further re hospitalization and no further hydration or artificial supplementations.  She did not want any blood draws.  2. Hematuria:  - for 2 weeks as per her daughter.  - on macrobid for recent UTI.  - her urologist is Dr Brunilda PayorNesi.  - requested urology for further recommendations. Recommended conservative management.  3. Anemia / hgb of 5.9/ anemia of blood loss from hematuria and anemia of chronic disease:  She was initially given prbc transfusions and her H&h improved to 9.2. 4. Dementia:  - without behavioural symptoms. 6. Pulmonary nodules:  - pt daughter is aware of the nodules and was offered further work up in august 2014 and she refused further workup and continues to do it.   Hypokalemia: - repleted before discharge.        Procedures:  none  Consultations:urology Palliative care  Discharge Exam: Filed Vitals:   12/09/13 0407  BP: 166/58  Pulse: 70  Temp: 98 F (36.7 C)  Resp: 20    General: sleeping comfortable Cardiovascular: s1s2 Respiratory: ctab  Discharge Instructions  Discharge Orders   Future Orders Complete By Expires   Discharge instructions  As directed    Comments:     Follow up with HOSPICE AS RECOMMENDED.       Medication List    STOP taking these medications       aspirin EC 81 MG tablet     losartan 25 MG tablet  Commonly known as:  COZAAR     nitrofurantoin 100 MG capsule  Commonly known as:  MACRODANTIN  TAKE these medications       brimonidine 0.15 % ophthalmic solution  Commonly known as:  ALPHAGAN  Place 1 drop into both eyes 2 (two) times daily.     CRANBERRY PO  Take 600 mg by mouth 2 (two) times daily.     donepezil 10 MG tablet  Commonly known as:  ARICEPT  Take 10 mg by mouth at bedtime.     DSS 100 MG Caps  Take 100 mg by mouth 2 (two) times daily.     ENSURE  Take 237 mLs by mouth See admin instructions. Takes 1 can two to three times daily, depending on appetite.     morphine CONCENTRATE 10 mg / 0.5 ml concentrated solution  Take 0.25 mLs (5 mg total) by mouth every 4 (four)  hours as needed for moderate pain or shortness of breath.     polyethylene glycol packet  Commonly known as:  MIRALAX / GLYCOLAX  Take 17 g by mouth every morning.       No Known Allergies     Follow-up Information   Follow up with Florentina Jenny, MD In 1 week. (As needed)    Specialty:  Family Medicine   Contact information:   [address] [place] [address] [phone]        The results of significant diagnostics from this hospitalization (including imaging, microbiology, ancillary and laboratory) are listed below for reference.    Significant Diagnostic Studies: Ct Abdomen Pelvis W Wo Contrast  12/08/2013   CLINICAL DATA:  Failure to thrive.  Gross hematuria.  EXAM: CT ABDOMEN AND PELVIS WITHOUT AND WITH CONTRAST  TECHNIQUE: Multidetector CT imaging of the abdomen and pelvis was performed without contrast material in one or both body regions, followed by contrast material(s) and further sections in one or both body regions.  CONTRAST:  OMNIPAQUE IOHEXOL 300 MG/ML  SOLN  COMPARISON:  CT ABD/PELV WO CM dated 10/18/2010; CT CHEST W/CM dated 04/12/2013  FINDINGS: Pre contrast imaging shows no right renal or ureteral stones.  5 mm Interpolar left renal stone is again noted. There is a new 3 mm stone in the posterior aspect of the interpolar left kidney. Pre contrast imaging shows high attenuation material in the left central collecting system and pelvis, extending down towards the UPJ. The  Imaging after IV contrast administration shows no enhancing lesion in the right kidney. There is moderate to marked hydronephrosis in the left kidney. High attenuation is seen expanding an intrarenal collecting system near the junction of the upper pole and interpolar region. This high attenuation material extends into the renal pelvis and down towards the UPJ. The there is no convincing evidence that this material enhances. Although the left ureter is not dilated, there does appear to  be some hyper enhancement diffusely in the wall of the left ureter down into the pelvis. The distal left ureter cannot be visualized secondary to mass effect from the extremity large volume of stool in the rectum. The  Delayed imaging shows no excretion of contrast into the collecting systems of the left kidney. Contrast excretion in the right kidney is normal in appearance.  The images which include the lung bases show multiple pulmonary nodules in the lower lobes, some of which are cavitated, as before. These nodules measure up to 2.1 cm in diameter with many measuring in the 1-2 cm size range.  No focal abnormality is evident within the liver or spleen. The stomach, duodenum, pancreas, gallbladder,  adrenal glands, and abdominal small bowel loops are unremarkable.  No lymphadenopathy in the abdomen.  Imaging through the pelvis shows dramatic distension of the distal sigmoid colon and rectum secondary to stool volume. This is stable. Coronal diameter of the rectum is 9.2 cm and imaging features suggest fecal impaction.  Foley catheter decompresses the urinary bladder and gas within the bladder is likely secondary to the instrumentation. On precontrast imaging, there is high attenuation material in the dependent portion of the bladder.  No evidence for pelvic sidewall lymphadenopathy. The uterus is not visualized. No evidence for adnexal mass.  Bone windows show diffuse demineralization. No worrisome lytic or sclerotic osseous abnormality is evident.  IMPRESSION: Pulmonary metastases, as before.  Interval development of moderate left hydronephrosis with high attenuation material filling upper pole and interpolar calices and extending into the renal pelvis down about the UPJ. This could represent hemorrhage, neoplasm, or infectious debris.  There is also some high attenuation material in the bladder lumen which may be originating from the left kidney.  Marked stool volume in the rectum suggesting fecal impaction.    Electronically Signed   By: Kennith Center M.D.   On: 12/08/2013 17:40   Dg Chest 2 View  12/07/2013   CLINICAL DATA:  Failure to thrive  EXAM: CHEST  2 VIEW  COMPARISON:  DG CHEST 1V PORT dated 04/16/2013; CT CHEST W/CM dated 04/12/2013  FINDINGS: Heart size upper normal. Aortic arch calcification stable. There are numerous cavitary lesions bilaterally throughout both lungs. These are primarily thick-walled cavitary lesions measuring up to 3 cm. One lesion measuring about 2.7 cm seen in the retrocardiac area on the frontal view does not appear to be particularly cavitary but appears more solid.  IMPRESSION: As has been previously described, there are numerous round lesions throughout both lungs, the vast majority of which are cavitary, which could be due to infection such as septic emboli or tuberculosis. Metastatic disease can have this appearance. Rheumatoid nodules are another consideration, as are fungal infections.   Electronically Signed   By: Esperanza Heir M.D.   On: 12/07/2013 19:40    Microbiology: Recent Results (from the past 240 hour(s))  URINE CULTURE     Status: None   Collection Time    12/07/13  8:01 PM      Result Value Ref Range Status   Specimen Description URINE, CATHETERIZED   Final   Special Requests NONE   Final   Culture  Setup Time     Final   Value: 12/08/2013 00:46     Performed at [facility]   Colony Count     Final   Value: NO GROWTH     Performed at [facility]   Culture     Final   Value: NO GROWTH     Performed at [facility]   Report Status 12/08/2013 FINAL   Final     Labs: Basic Metabolic Panel:  Recent Labs Lab 12/07/13 1858 12/08/13 0340 12/09/13 0500 12/09/13 0600  NA 139 144  --  144  K 4.5 3.6*  --  2.7*  CL 104 109  --  111  CO2  --  17*  --  22  GLUCOSE 130* 96  --  98  BUN 44* 29*  --  11  CREATININE 0.90 0.59  --  0.51  CALCIUM  --  9.7  --  9.0  MG  --   --  1.8  --  Liver Function Tests: No results  found for this basename: AST, ALT, ALKPHOS, BILITOT, PROT, ALBUMIN,  in the last 168 hours No results found for this basename: LIPASE, AMYLASE,  in the last 168 hours No results found for this basename: AMMONIA,  in the last 168 hours CBC:  Recent Labs Lab 12/07/13 1815 12/07/13 1858 12/08/13 0340 12/08/13 1546 12/08/13 2221 12/09/13 0600  WBC 10.2  --  10.0  --   --   --   NEUTROABS 8.3*  --   --   --   --   --   HGB 7.1* 7.8* 5.9* 9.6* 9.7* 9.2*  HCT 22.5* 23.0* 19.3* 29.5* 30.0* 27.7*  MCV 85.2  --  87.7  --   --   --   PLT 379  --  290  --   --   --    Cardiac Enzymes: No results found for this basename: CKTOTAL, CKMB, CKMBINDEX, TROPONINI,  in the last 168 hours BNP: BNP (last 3 results) No results found for this basename: PROBNP,  in the last 8760 hours CBG: No results found for this basename: GLUCAP,  in the last 168 hours     Signed:  Kyren Vaux  [facility] 12/09/2013, 9:35 AM

## 2013-12-09 NOTE — Progress Notes (Addendum)
CS[facility] Cookie for transportation needs. Patient will need non-emergency ambulance transport home. CSW confirmed home address with patient's daughter at bedside, [name]. [facility] called for transport (Service Request Id: [id number]). No other CSW needs identified - CSW signing off.   Lincoln MaxinKelly Manning Luna, LCSW [facility] Clinical Social Worker cell #: [phone]

## 2013-12-09 NOTE — Progress Notes (Signed)
Notified by Susanne BordersM McGibboney CMRN, patient and family request services of [facility] after discharge. Patient information reviewed with Dr Elliot Gurneyarlos Monguilod, [facility] Medical Director, and eligibility confirmed.  Spoke with daughter Earleen NewportMarie Parker at bedside to initiate education related to hospice services, philosophy and team approach to care; [name] voiced good understanding of information provided.  Per discussion with [name] plan is to d/c today by non-emergent transport; daughter indicated she did not want Foley catheter to remain in at discharge; she is aware pt may continue to have blood tinged urine and she does not wish for further investigation or procedures; [name] stated the plan was to transition to oral antibiotics at d/c and once home, [name] understands her mother may or may not take the medication depending on her alertness- [name] is ok with this, stating she plans to take one day at a time - [name] shared she is aware her mother's condition is declining and her wish is for her to be comfortable and for [facility] to assist with needs at EOL.  - per discussion with daughter Gretta BeganMarie, [facility] will plan for assessment RN visit tomorrow Friday 12/10/13 @ 9:30 am  *Please send completed GOLD DNR Form home w/pt * Noted order for Concentrated Liquid Morphine 10mg /0.225ml give 5mg  q4 hrs prn pain, sob;  MD PLEASE SPECIFY Dispense Quantity =30 ml on RX at discharge  DME needs discussed - pt currently has fully electric hospital bed in the home and per daughter they have no equipment needs at this time. Patient's PCP Dr Redmond Schoolripp; [facility] [address] Initial [facility] * Completed d/c summary will need to be faxed [facility] @ [phone] when final Please notify [facility] when patient is ready to leave unit at d/c call [phone] (or [phone] if after 5 pm);  [facility] information and contact numbers also given to daughter [name] during visit.    Above information shared with CMRN M McGibboney Please call with any questions or concerns   Valente DavidMargie Manford Sprong, RN 12/09/2013, 1:01 PM [facility] RN Liaison [phone]

## 2013-12-10 NOTE — Consult Note (Signed)
I have reviewed this case with our NP and agree with the Assessment and Plan as stated.  Ian Castagna L. Jermaine Tholl, MD MBA The Palliative Medicine Team at [place] Team Phone: [phone] Pager: [phone]   

## 2014-02-07 DEATH — deceased

## 2014-11-13 IMAGING — CT CT ABD-PEL WO/W CM
2 of 11 series · 12 of 46 positions shown, 17 images · IV contrast (omnipaque)
Comparison: CT ABD/PELV WO CM dated 10/18/2010; CT CHEST W/CM dated
04/12/2013

CLINICAL DATA: Failure to thrive.  Gross hematuria.

EXAM:
CT ABDOMEN AND PELVIS WITHOUT AND WITH CONTRAST
TECHNIQUE: Multidetector CT imaging of the abdomen and pelvis was performed
without contrast material in one or both body regions, followed by
contrast material(s) and further sections in one or both body
regions.
CONTRAST:  100mL OMNIPAQUE IOHEXOL 300 MG/ML  SOLN

[Series 6: images with contrast · axial · 0.61mm/px · z∈[-696,-330]mm · 10 of 144 slices shown, 15 images]
[im 11/144  soft-tissue]
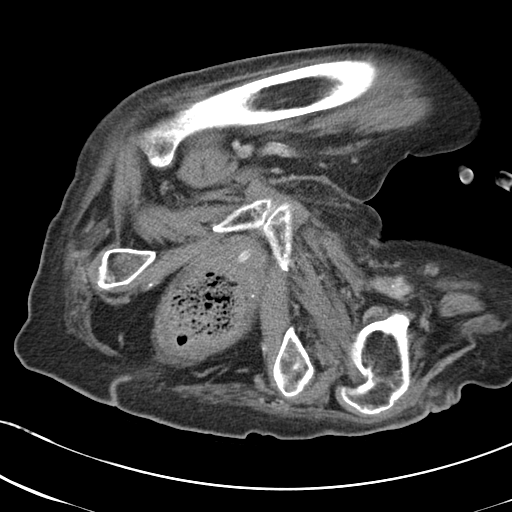
[im 11/144  bone]
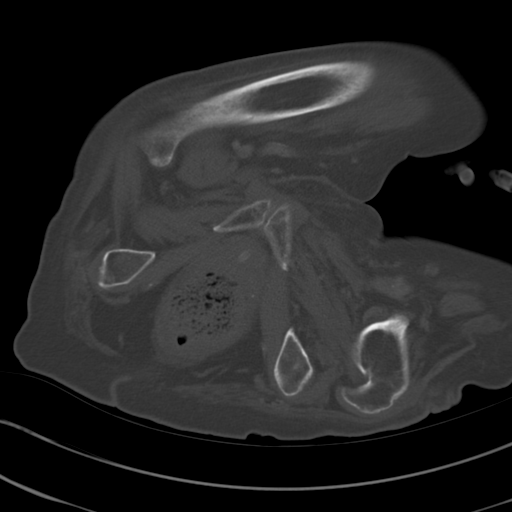
[im 31/144  soft-tissue]
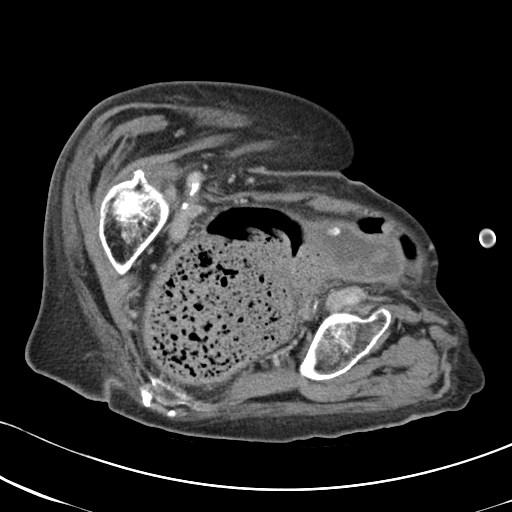
[im 41/144  soft-tissue]
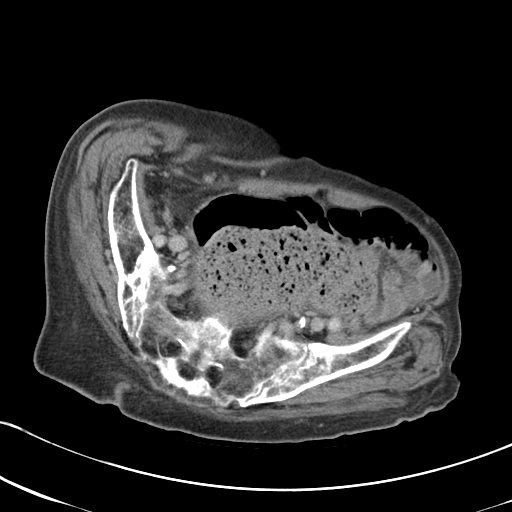
[im 62/144  soft-tissue]
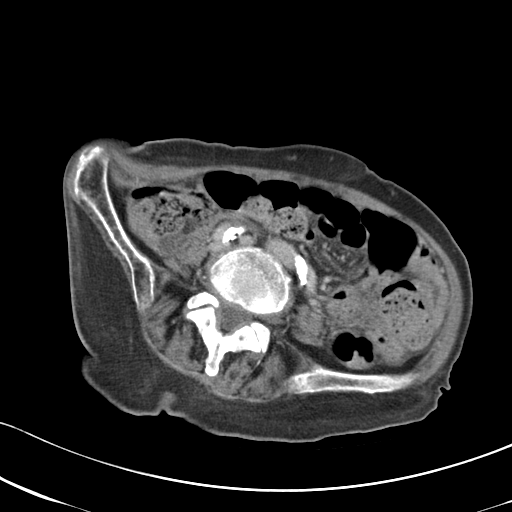
[im 72/144  soft-tissue]
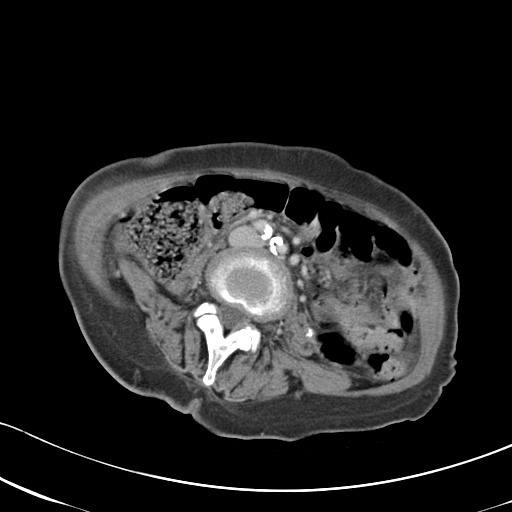
[im 82/144  soft-tissue]
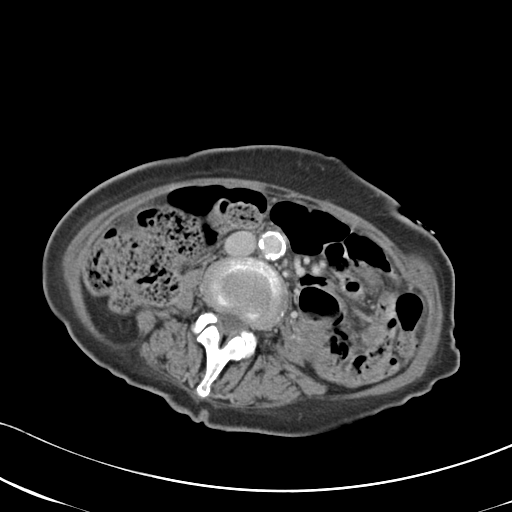
[im 103/144  soft-tissue]
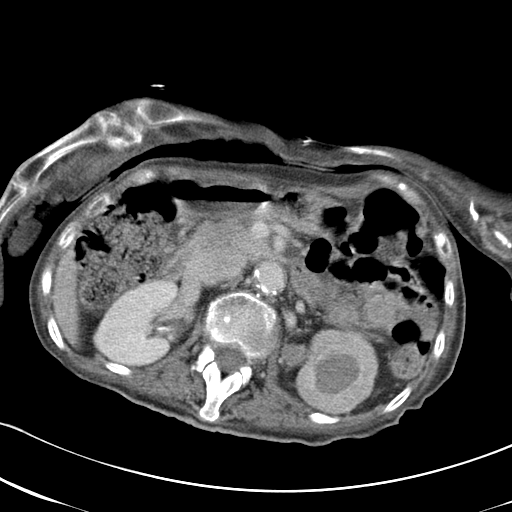
[im 103/144  lung]
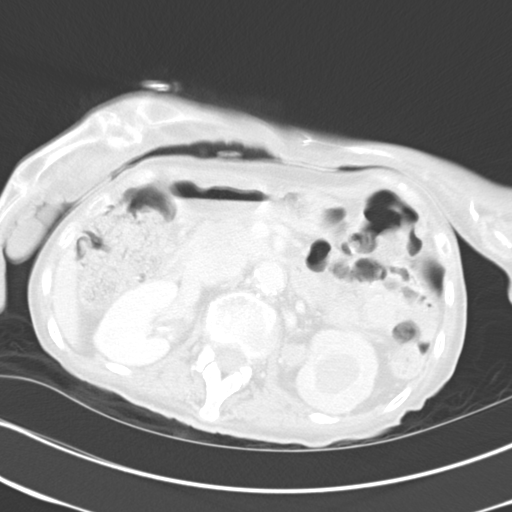
[im 113/144  soft-tissue]
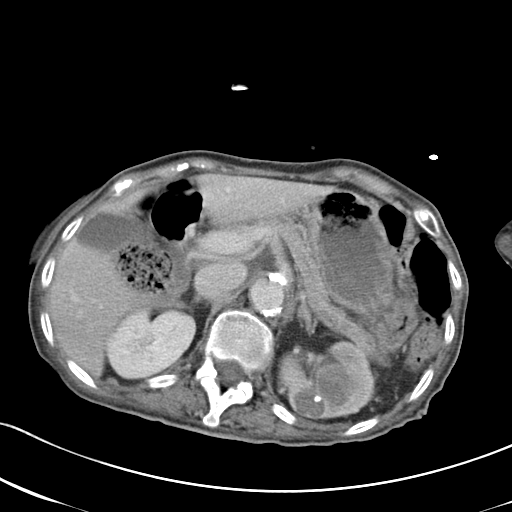
[im 113/144  lung]
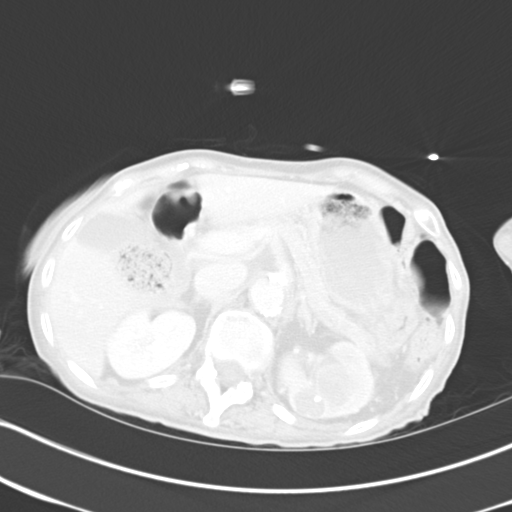
[im 123/144  lung]
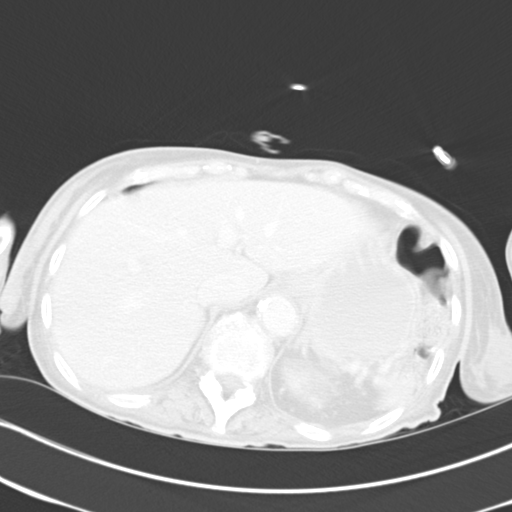
[im 133/144  soft-tissue]
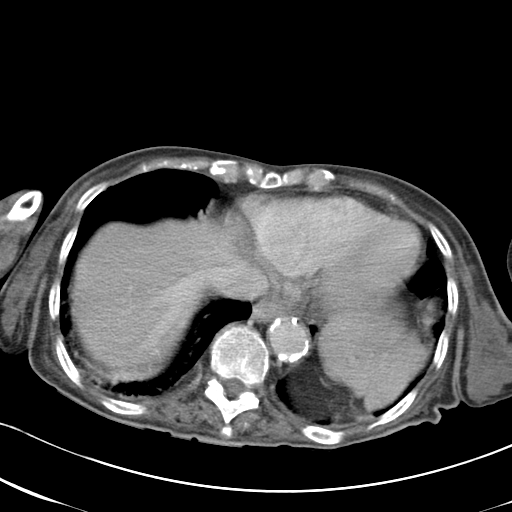
[im 133/144  lung]
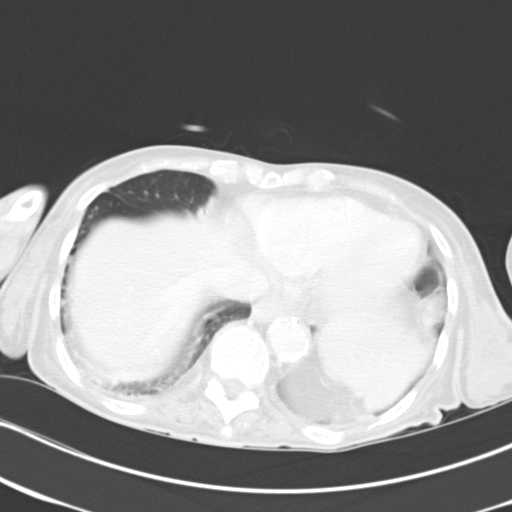
[im 133/144  bone]
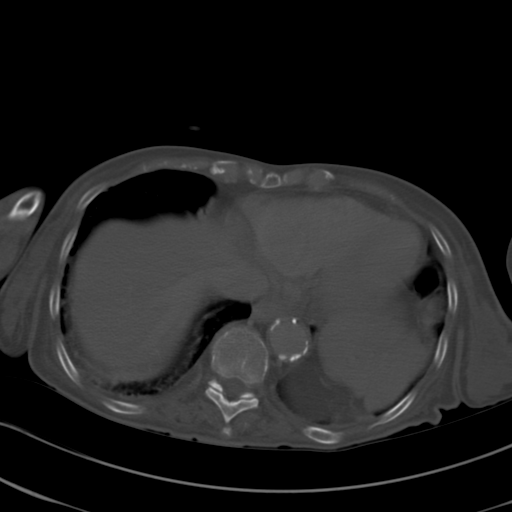

[Series 602: <mpr thick range> · coronal · 0.61mm/px · 2 of 66 slices shown]
[im 22/66  soft-tissue]
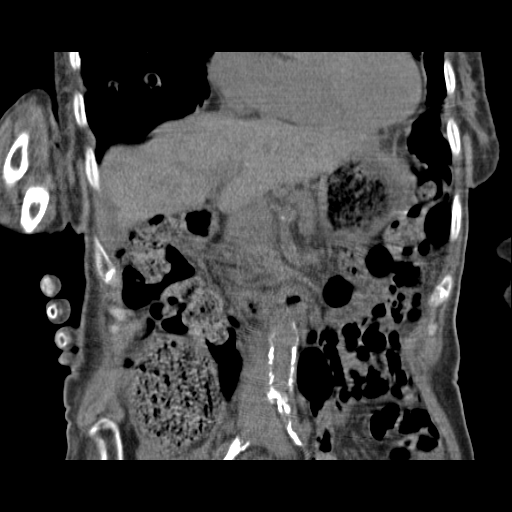
[im 44/66  soft-tissue]
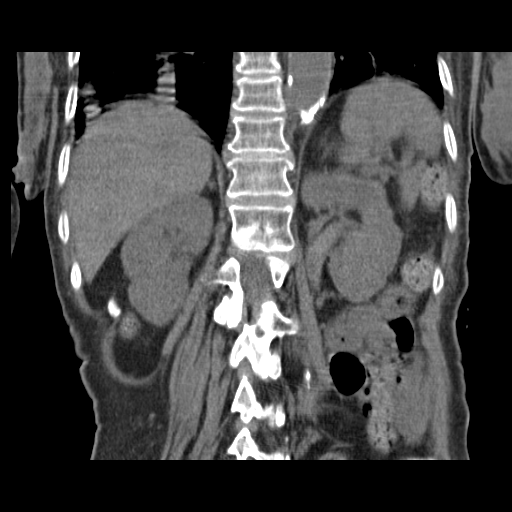

[12 of 46 positions shown; findings below may reference images not displayed]

FINDINGS: Pre contrast imaging shows no right renal or ureteral stones.

5 mm Interpolar left renal stone is again noted. There is a new 3 mm
stone in the posterior aspect of the interpolar left kidney. Pre
contrast imaging shows high attenuation material in the left central
collecting system and pelvis, extending down towards the UPJ. The

Imaging after IV contrast administration shows no enhancing lesion
in the right kidney. There is moderate to marked hydronephrosis in
the left kidney. High attenuation is seen expanding an intrarenal
collecting system near the junction of the upper pole and interpolar
region. This high attenuation material extends into the renal pelvis
and down towards the UPJ. The there is no convincing evidence that
this material enhances. Although the left ureter is not dilated,
there does appear to be some hyper enhancement diffusely in the wall
of the left ureter down into the pelvis. The distal left ureter
cannot be visualized secondary to mass effect from the extremity
large volume of stool in the rectum. The

Delayed imaging shows no excretion of contrast into the collecting
systems of the left kidney. Contrast excretion in the right kidney
is normal in appearance.

The images which include the lung bases show multiple pulmonary
nodules in the lower lobes, some of which are cavitated, as before.
These nodules measure up to 2.1 cm in diameter with many measuring
in the 1-2 cm size range.

No focal abnormality is evident within the liver or spleen. The
stomach, duodenum, pancreas, gallbladder, adrenal glands, and
abdominal small bowel loops are unremarkable.

No lymphadenopathy in the abdomen.

Imaging through the pelvis shows dramatic distension of the distal
sigmoid colon and rectum secondary to stool volume. This is stable.
Coronal diameter of the rectum is 9.2 cm and imaging features
suggest fecal impaction.

Foley catheter decompresses the urinary bladder and gas within the
bladder is likely secondary to the instrumentation. On precontrast
imaging, there is high attenuation material in the dependent portion
of the bladder.

No evidence for pelvic sidewall lymphadenopathy. The uterus is not
visualized. No evidence for adnexal mass.

Bone windows show diffuse demineralization. No worrisome lytic or
sclerotic osseous abnormality is evident.
IMPRESSION: Pulmonary metastases, as before.

Interval development of moderate left hydronephrosis with high
attenuation material filling upper pole and interpolar calices and
extending into the renal pelvis down about the UPJ. This could
represent hemorrhage, neoplasm, or infectious debris.

There is also some high attenuation material in the bladder lumen
which may be originating from the left kidney.

Marked stool volume in the rectum suggesting fecal impaction.
